# Patient Record
Sex: Male | Born: 1980 | Race: Black or African American | Hispanic: No | Marital: Single | Smoking: Former smoker
Health system: Southern US, Community
[De-identification: ages and names within clinical notes are randomized; demographics above are authoritative.]

## PROBLEM LIST (undated history)

## (undated) DIAGNOSIS — S0292XA Unspecified fracture of facial bones, initial encounter for closed fracture: Secondary | ICD-10-CM

## (undated) DIAGNOSIS — R519 Headache, unspecified: Secondary | ICD-10-CM

## (undated) DIAGNOSIS — T7840XA Allergy, unspecified, initial encounter: Secondary | ICD-10-CM

## (undated) DIAGNOSIS — M25511 Pain in right shoulder: Secondary | ICD-10-CM

## (undated) DIAGNOSIS — R51 Headache: Secondary | ICD-10-CM

## (undated) DIAGNOSIS — F419 Anxiety disorder, unspecified: Secondary | ICD-10-CM

## (undated) DIAGNOSIS — R011 Cardiac murmur, unspecified: Secondary | ICD-10-CM

## (undated) HISTORY — DX: Headache, unspecified: R51.9

## (undated) HISTORY — DX: Anxiety disorder, unspecified: F41.9

## (undated) HISTORY — DX: Pain in right shoulder: M25.511

## (undated) HISTORY — DX: Headache: R51

## (undated) HISTORY — DX: Unspecified fracture of facial bones, initial encounter for closed fracture: S02.92XA

## (undated) HISTORY — DX: Allergy, unspecified, initial encounter: T78.40XA

## (undated) HISTORY — PX: WISDOM TOOTH EXTRACTION: SHX21

## (undated) HISTORY — DX: Cardiac murmur, unspecified: R01.1

---

## 2004-10-21 ENCOUNTER — Emergency Department (HOSPITAL_COMMUNITY): Admission: EM | Admit: 2004-10-21 | Discharge: 2004-10-21 | Payer: Self-pay | Admitting: Emergency Medicine

## 2014-08-08 ENCOUNTER — Other Ambulatory Visit: Payer: Self-pay | Admitting: Emergency Medicine

## 2014-08-08 ENCOUNTER — Telehealth: Payer: Self-pay

## 2014-08-08 ENCOUNTER — Ambulatory Visit (INDEPENDENT_AMBULATORY_CARE_PROVIDER_SITE_OTHER): Payer: BC Managed Care – PPO | Admitting: Internal Medicine

## 2014-08-08 ENCOUNTER — Ambulatory Visit (INDEPENDENT_AMBULATORY_CARE_PROVIDER_SITE_OTHER): Payer: BC Managed Care – PPO

## 2014-08-08 VITALS — BP 110/74 | HR 80 | Temp 98.3°F | Resp 18 | Ht 66.25 in | Wt 168.0 lb

## 2014-08-08 DIAGNOSIS — R0602 Shortness of breath: Secondary | ICD-10-CM

## 2014-08-08 DIAGNOSIS — R05 Cough: Secondary | ICD-10-CM

## 2014-08-08 DIAGNOSIS — R059 Cough, unspecified: Secondary | ICD-10-CM

## 2014-08-08 DIAGNOSIS — Z113 Encounter for screening for infections with a predominantly sexual mode of transmission: Secondary | ICD-10-CM

## 2014-08-08 DIAGNOSIS — R002 Palpitations: Secondary | ICD-10-CM

## 2014-08-08 DIAGNOSIS — J4520 Mild intermittent asthma, uncomplicated: Secondary | ICD-10-CM

## 2014-08-08 DIAGNOSIS — R5383 Other fatigue: Secondary | ICD-10-CM

## 2014-08-08 LAB — POCT CBC
Granulocyte percent: 64.4 %G (ref 37–80)
HEMATOCRIT: 46.7 % (ref 43.5–53.7)
Hemoglobin: 15.3 g/dL (ref 14.1–18.1)
Lymph, poc: 2.5 (ref 0.6–3.4)
MCH: 30.1 pg (ref 27–31.2)
MCHC: 32.8 g/dL (ref 31.8–35.4)
MCV: 91.7 fL (ref 80–97)
MID (CBC): 0.5 (ref 0–0.9)
MPV: 9.4 fL (ref 0–99.8)
PLATELET COUNT, POC: 123 10*3/uL — AB (ref 142–424)
POC Granulocyte: 5.4 (ref 2–6.9)
POC LYMPH %: 29.2 % (ref 10–50)
POC MID %: 6.4 %M (ref 0–12)
RBC: 5.09 M/uL (ref 4.69–6.13)
RDW, POC: 13.4 %
WBC: 8.4 10*3/uL (ref 4.6–10.2)

## 2014-08-08 MED ORDER — AMOXICILLIN 875 MG PO TABS: 875.0000 mg | ORAL_TABLET | Freq: Two times a day (BID) | ORAL | Status: DC

## 2014-08-08 MED ORDER — PREDNISONE 20 MG PO TABS: ORAL_TABLET | ORAL | Status: DC

## 2014-08-08 MED ORDER — FLUTICASONE PROPIONATE 50 MCG/ACT NA SUSP: NASAL | Status: DC

## 2014-08-08 NOTE — Telephone Encounter (Signed)
Pt states he just saw dr Merla Richesdoolittle and was supposed to give him a list pcp providers but he does not have it   Please call [phone]

## 2014-08-08 NOTE — Progress Notes (Signed)
   Subjective:    Patient ID: Billy [name], male    DOB: 02/28/1981, 33 y.o.   MRN: [id number]  HPI coug for 2 months--started with allergies or cold--now interfe w/ sleep. Wakes every few nights with sob and palpitat and feeling he can't get air--breaths hard for 1-2 hr before resolves No fever or wt loss nonproduc No ST No hx asthma No gerd/dyspeps  Also feels tired often tho not inhibiting work or workouts Wants testing for [employer]([place] healthcare [place]  Also wants std scr-more than one partner in last 6 mo tho steady gf now-no sxtoms Just urinated   Review of Systems No chest pain No anxiety No gi/gu issues    Objective:   Physical Exam BP 110/74 mmHg  Pulse 80  Temp(Src) 98.3 F (36.8 C) (Oral)  Resp 18  Ht 5' 6.25" (1.683 m)  Wt 168 lb (76.204 kg)  BMI 26.90 kg/m2  SpO2 98% HEENT- purulent d/c nares Inject conjunctivae No nodes Chest w/ wheezing forced expiration--mild/produces cough Ht reg w/out m     [facility] reading (PRIMARY) by  Dr.Doolittle=NAD Results for orders placed or performed in visit on 08/08/14  POCT CBC  Result Value Ref Range   WBC 8.4 4.6 - 10.2 K/uL   Lymph, poc 2.5 0.6 - 3.4   POC LYMPH PERCENT 29.2 10 - 50 %L   MID (cbc) 0.5 0 - 0.9   POC MID % 6.4 0 - 12 %M   POC Granulocyte 5.4 2 - 6.9   Granulocyte percent 64.4 37 - 80 %G   RBC 5.09 4.69 - 6.13 M/uL   Hemoglobin 15.3 14.1 - 18.1 g/dL   HCT, POC 82.946.7 56.243.5 - 53.7 %   MCV 91.7 80 - 97 fL   MCH, POC 30.1 27 - 31.2 pg   MCHC 32.8 31.8 - 35.4 g/dL   RDW, POC 13.013.4 %   Platelet Count, POC 123 (A) 142 - 424 K/uL   MPV 9.4 0 - 99.8 fL     Assessment & Plan:  Cough - Plan: POCT CBC, DG Chest 2 View, CANCELED: DG Chest 2 View  Palpitations - Plan: Lipid panel  SOB (shortness of breath) - Plan: CANCELED: DG Chest 2 View  Other fatigue - Plan: POCT CBC, Comprehensive metabolic panel  Screen for STD (sexually transmitted disease) - Plan: HIV  antibody, RPR  F/u for uriprobe  RAD (reactive airway disease), mild intermittent, uncomplicated  Due to AR with persistent sinusitis  Meds ordered this encounter  Medications  . valACYclovir (VALTREX) 1000 MG tablet    Sig:     Refill:  0  . amoxicillin (AMOXIL) 875 MG tablet    Sig: Take 1 tablet (875 mg total) by mouth 2 (two) times daily.    Dispense:  20 tablet    Refill:  0  . fluticasone (FLONASE) 50 MCG/ACT nasal spray    Sig: 2 spr each nostril once a day for 1-2 months    Dispense:  16 g    Refill:  6  . predniSONE (DELTASONE) 20 MG tablet    Sig: 3/3/2/2/1/1 single daily dose for 6 days    Dispense:  12 tablet    Refill:  0   Notify labs Mail PCP ideas Note thrombocytopenia in letter

## 2014-08-09 LAB — COMPREHENSIVE METABOLIC PANEL
ALT: 24 U/L (ref 0–53)
AST: 43 U/L — AB (ref 0–37)
Albumin: 4.3 g/dL (ref 3.5–5.2)
Alkaline Phosphatase: 62 U/L (ref 39–117)
BUN: 13 mg/dL (ref 6–23)
CALCIUM: 9.2 mg/dL (ref 8.4–10.5)
CHLORIDE: 104 meq/L (ref 96–112)
CO2: 24 mEq/L (ref 19–32)
CREATININE: 1.03 mg/dL (ref 0.50–1.35)
GLUCOSE: 79 mg/dL (ref 70–99)
Potassium: 3.5 mEq/L (ref 3.5–5.3)
Sodium: 139 mEq/L (ref 135–145)
Total Bilirubin: 2 mg/dL — ABNORMAL HIGH (ref 0.2–1.2)
Total Protein: 7.1 g/dL (ref 6.0–8.3)

## 2014-08-09 LAB — LIPID PANEL
CHOL/HDL RATIO: 3.2 ratio
CHOLESTEROL: 135 mg/dL (ref 0–200)
HDL: 42 mg/dL (ref >39)
LDL Cholesterol: 79 mg/dL (ref 0–99)
TRIGLYCERIDES: 69 mg/dL (ref <150)
VLDL: 14 mg/dL (ref 0–40)

## 2014-08-09 LAB — HIV ANTIBODY (ROUTINE TESTING W REFLEX): HIV: NONREACTIVE

## 2014-08-09 LAB — RPR

## 2014-08-09 NOTE — Telephone Encounter (Signed)
Spoke to pt to advise PCP.

## 2014-08-09 NOTE — Telephone Encounter (Signed)
Dr Neva SeatGreene or [facility]Shaw [facility] Dr Karleen HampshireSpencer [place] [facility] [place] Kristian CoveyShane Tysinger [facility] [place]

## 2014-08-14 LAB — HEPATITIS C ANTIBODY: HCV Ab: NEGATIVE

## 2014-08-24 ENCOUNTER — Encounter: Payer: Self-pay | Admitting: Internal Medicine

## 2015-06-19 ENCOUNTER — Ambulatory Visit (INDEPENDENT_AMBULATORY_CARE_PROVIDER_SITE_OTHER): Payer: BLUE CROSS/BLUE SHIELD | Admitting: Family Medicine

## 2015-06-19 VITALS — BP 118/78 | HR 80 | Temp 98.4°F | Resp 16 | Ht 66.25 in | Wt 163.2 lb

## 2015-06-19 DIAGNOSIS — Z8709 Personal history of other diseases of the respiratory system: Secondary | ICD-10-CM | POA: Diagnosis not present

## 2015-06-19 DIAGNOSIS — G441 Vascular headache, not elsewhere classified: Secondary | ICD-10-CM | POA: Diagnosis not present

## 2015-06-19 DIAGNOSIS — R05 Cough: Secondary | ICD-10-CM | POA: Diagnosis not present

## 2015-06-19 DIAGNOSIS — R002 Palpitations: Secondary | ICD-10-CM | POA: Diagnosis not present

## 2015-06-19 DIAGNOSIS — R059 Cough, unspecified: Secondary | ICD-10-CM

## 2015-06-19 LAB — LIPID PANEL
Cholesterol: 169 mg/dL (ref 125–200)
HDL: 64 mg/dL (ref >=40)
LDL Cholesterol: 93 mg/dL (ref <130)
Total CHOL/HDL Ratio: 2.6 Ratio (ref <=5.0)
Triglycerides: 59 mg/dL (ref <150)
VLDL: 12 mg/dL (ref <30)

## 2015-06-19 LAB — POCT GLYCOSYLATED HEMOGLOBIN (HGB A1C): Hemoglobin A1C: 5.3

## 2015-06-19 LAB — BASIC METABOLIC PANEL WITH GFR
BUN: 12 mg/dL (ref 7–25)
CO2: 27 mmol/L (ref 20–31)
Calcium: 9.3 mg/dL (ref 8.6–10.3)
Chloride: 102 mmol/L (ref 98–110)
Creat: 0.94 mg/dL (ref 0.60–1.35)
GFR, Est African American: >89 mL/min (ref >=60)
GFR, Est Non African American: >89 mL/min (ref >=60)
Glucose, Bld: 91 mg/dL (ref 65–99)
Potassium: 4 mmol/L (ref 3.5–5.3)
Sodium: 138 mmol/L (ref 135–146)

## 2015-06-19 LAB — GLUCOSE, POCT (MANUAL RESULT ENTRY): POC Glucose: 87 mg/dl (ref 70–99)

## 2015-06-19 LAB — HEMOGLOBIN A1C: HEMOGLOBIN A1C: 5.3 % (ref 4.0–6.0)

## 2015-06-19 MED ORDER — HYDROCODONE-HOMATROPINE 5-1.5 MG/5ML PO SYRP: 5.0000 mL | ORAL_SOLUTION | Freq: Three times a day (TID) | ORAL | Status: DC | PRN

## 2015-06-19 MED ORDER — AZITHROMYCIN 250 MG PO TABS: ORAL_TABLET | ORAL | Status: DC

## 2015-06-19 MED ORDER — ALBUTEROL SULFATE HFA 108 (90 BASE) MCG/ACT IN AERS: 2.0000 | INHALATION_SPRAY | RESPIRATORY_TRACT | Status: DC | PRN

## 2015-06-19 NOTE — Progress Notes (Addendum)
This chart was scribed for Elvina SidleKurt Everitt Wenner, MD by Stann Oresung-Kai Tsai, medical scribe at [facility].The patient was seen in exam room 9 and the patient's care was started at 9:39 AM.  Patient ID: Billy [name] MRN: [id number], DOB: 06/17/1981, 34 y.o. Date of Encounter: 06/19/2015  Primary Physician: No PCP Per Patient  Chief Complaint:  Chief Complaint  Patient presents with  . Cough    x 1 week  . Shortness of Breath    x 1 week  . Chest Pain    x 1 week    HPI:  Billy [name] is a 34 y.o. male who [facility] complaining of shortness of breath with dry cough and chest pain starting 8 days ago. He states that he has occasional clear mucus when he coughs. He coughs more at night when he goes to bed than morning. He has h/o asthma when he was a child and used an inhaler. He denies having an inhaler now. He denies fever. He denies smoking. Last year, he came in with similar issue, and at that time, he would wake up gasping for air. He notes top of his forehead throbbing and racing heart beat yesterday.   He also noticed that he gets an uncomfortable feeling after eating butter in his foods recently. He recently ate foods that he normally wouldn't eat, greasy and some alcohol.   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   Past Medical History  Diagnosis Date  . Allergy   . Anxiety      Home Meds: Prior to Admission medications   Medication Sig Start Date End Date Taking? Authorizing Provider  amoxicillin (AMOXIL) 875 MG tablet Take 1 tablet (875 mg total) by mouth 2 (two) times daily. Patient not taking: Reported on 06/19/2015 08/08/14   Tonye Pearsonobert P Doolittle, MD  fluticasone [facility](FLONASE) 50 MCG/ACT nasal spray 2 spr each nostril once a day for 1-2 months Patient not taking: Reported on 06/19/2015 08/08/14   Tonye Pearsonobert P Doolittle, MD  predniSONE (DELTASONE) 20 MG tablet 3/3/2/2/1/1 single daily  dose for 6 days Patient not taking: Reported on 06/19/2015 08/08/14   Tonye Pearsonobert P Doolittle, MD  valACYclovir (VALTREX) 1000 MG tablet  06/11/14   Historical Provider, MD    Allergies:  Allergies  Allergen Reactions  . Dust Mite Extract   . Molds & Smuts     Social History   Social History  . Marital Status: Single    Spouse Name: N/A  . Number of Children: N/A  . Years of Education: N/A   Occupational History  . Not on file.   Social History Main Topics  . Smoking status: Former Smoker -- 0.50 packs/day for 1.5 years    Types: Cigarettes  . Smokeless tobacco: Not on file  . Alcohol Use: 1.8 - 2.4 oz/week    3-4 Standard drinks or equivalent per week  . Drug Use: No  . Sexual Activity: Not on file   Other Topics Concern  . Not on file   Social History Narrative     Review of Systems: Constitutional: negative for chills, fever, night sweats, weight changes, or fatigue  HEENT: negative for vision changes, hearing loss, congestion, rhinorrhea, ST, epistaxis, or sinus pressure Cardiovascular: negative for palpitations; positive for chest pain Respiratory: negative for hemoptysis, wheezing; positive for cough, shortness of breath Abdominal: negative for abdominal pain, nausea, vomiting, diarrhea, or constipation Dermatological: negative for rash  Neurologic: negative for dizziness, or syncope; positive for headache All other systems reviewed and are otherwise negative with the exception to those above and in the HPI.  Physical Exam: Blood pressure 118/78, pulse 80, temperature 98.4 F (36.9 C), temperature source Oral, resp. rate 16, height 5' 6.25" (1.683 m), weight 163 lb 3.2 oz (74.027 kg), SpO2 98 %., Body mass index is 26.13 kg/(m^2). General: Well developed, well nourished, in no acute distress. Head: Normocephalic, atraumatic, eyes without discharge, sclera non-icteric, nares are without discharge. Bilateral auditory canals clear, TM's are without perforation, pearly  grey and translucent with reflective cone of light bilaterally. Oral cavity moist, posterior pharynx without exudate, erythema, peritonsillar abscess, or post nasal drip.  Neck: Supple. No thyromegaly. Full ROM. No lymphadenopathy. Lungs: Clear bilaterally to auscultation without wheezes, rales, or rhonchi. Breathing is unlabored. Heart: RRR with S1 S2. No murmurs, rubs, or gallops appreciated. Abdomen: Soft, non-tender, non-distended with normoactive bowel sounds. No hepatomegaly. No rebound/guarding. No obvious abdominal masses. Msk:  Strength and tone normal for age. Extremities/Skin: Warm and dry. No clubbing or cyanosis. No edema. No rashes or suspicious lesions. Neuro: Alert and oriented X 3. Moves all extremities spontaneously. Gait is normal. CNII-XII grossly in tact. Psych:  Responds to questions appropriately with a normal affect.   Labs: Results for orders placed or performed in visit on 06/19/15  POCT glycosylated hemoglobin (Hb A1C)  Result Value Ref Range   Hemoglobin A1C 5.3   POCT glucose (manual entry)  Result Value Ref Range   POC Glucose 87 70 - 99 mg/dl     ASSESSMENT AND PLAN:  34 y.o. year old male with anxiety about his cough, palpitations and pulsating headaches.  The palpitations and headache are intermittent and not present today.  He appears to be in good health. This chart was scribed in my presence and reviewed by me personally.    ICD-9-CM ICD-10-CM   1. Cough 786.2 R05 albuterol (PROVENTIL HFA;VENTOLIN HFA) 108 (90 BASE) MCG/ACT inhaler     azithromycin (ZITHROMAX) 250 MG tablet     HYDROcodone-homatropine (HYCODAN) 5-1.5 MG/5ML syrup  2. Palpitations 785.1 R00.2 EKG 12-Lead     POCT glycosylated hemoglobin (Hb A1C)     POCT glucose (manual entry)     BASIC METABOLIC PANEL WITH GFR     Lipid panel  3. H/O intrinsic asthma V12.69 Z87.09   4. Other vascular headache 784.0 G44.1     By signing my name below, I, Stann Ore, attest that this  documentation has been prepared under the direction and in the presence of Elvina Sidle, MD. Electronically Signed: Stann Ore, Scribe. 06/19/2015 , 9:39 AM .  Signed, Elvina Sidle, MD 06/19/2015 9:39 AM

## 2015-06-19 NOTE — Patient Instructions (Signed)

## 2015-06-26 ENCOUNTER — Encounter: Payer: Self-pay | Admitting: Family Medicine

## 2015-07-01 DIAGNOSIS — T7840XA Allergy, unspecified, initial encounter: Secondary | ICD-10-CM | POA: Insufficient documentation

## 2015-07-01 DIAGNOSIS — F419 Anxiety disorder, unspecified: Secondary | ICD-10-CM | POA: Insufficient documentation

## 2015-07-04 ENCOUNTER — Ambulatory Visit: Payer: Self-pay | Admitting: Cardiovascular Disease

## 2015-07-15 ENCOUNTER — Ambulatory Visit (INDEPENDENT_AMBULATORY_CARE_PROVIDER_SITE_OTHER): Payer: BLUE CROSS/BLUE SHIELD | Admitting: Physician Assistant

## 2015-07-15 ENCOUNTER — Encounter: Payer: Self-pay | Admitting: Physician Assistant

## 2015-07-15 ENCOUNTER — Ambulatory Visit (INDEPENDENT_AMBULATORY_CARE_PROVIDER_SITE_OTHER): Payer: BLUE CROSS/BLUE SHIELD

## 2015-07-15 VITALS — BP 125/68 | HR 92 | Temp 98.5°F | Resp 16 | Ht 65.75 in | Wt 161.2 lb

## 2015-07-15 DIAGNOSIS — R058 Other specified cough: Secondary | ICD-10-CM

## 2015-07-15 DIAGNOSIS — R059 Cough, unspecified: Secondary | ICD-10-CM

## 2015-07-15 DIAGNOSIS — R05 Cough: Secondary | ICD-10-CM

## 2015-07-15 DIAGNOSIS — J329 Chronic sinusitis, unspecified: Secondary | ICD-10-CM

## 2015-07-15 MED ORDER — GUAIFENESIN ER 1200 MG PO TB12: 1.0000 | ORAL_TABLET | Freq: Two times a day (BID) | ORAL | Status: DC | PRN

## 2015-07-15 MED ORDER — HYDROCOD POLST-CPM POLST ER 10-8 MG/5ML PO SUER: 5.0000 mL | Freq: Every evening | ORAL | Status: DC | PRN

## 2015-07-15 MED ORDER — AMOXICILLIN-POT CLAVULANATE 875-125 MG PO TABS: 1.0000 | ORAL_TABLET | Freq: Two times a day (BID) | ORAL | Status: AC

## 2015-07-15 MED ORDER — IPRATROPIUM BROMIDE 0.03 % NA SOLN: 2.0000 | Freq: Two times a day (BID) | NASAL | Status: DC

## 2015-07-15 NOTE — Patient Instructions (Addendum)
Please use the nasal saline spray daily.   64 oz of water per day.    Sinusitis, Adult Sinusitis is redness, soreness, and inflammation of the paranasal sinuses. Paranasal sinuses are air pockets within the bones of your face. They are located beneath your eyes, in the middle of your forehead, and above your eyes. In healthy paranasal sinuses, mucus is able to drain out, and air is able to circulate through them by way of your nose. However, when your paranasal sinuses are inflamed, mucus and air can become trapped. This can allow bacteria and other germs to grow and cause infection. Sinusitis can develop quickly and last only a short time (acute) or continue over a long period (chronic). Sinusitis that lasts for more than 12 weeks is considered chronic. CAUSES Causes of sinusitis include:  Allergies.  Structural abnormalities, such as displacement of the cartilage that separates your nostrils (deviated septum), which can decrease the air flow through your nose and sinuses and affect sinus drainage.  Functional abnormalities, such as when the small hairs (cilia) that line your sinuses and help remove mucus do not work properly or are not present. SIGNS AND SYMPTOMS Symptoms of acute and chronic sinusitis are the same. The primary symptoms are pain and pressure around the affected sinuses. Other symptoms include:  Upper toothache.  Earache.  Headache.  Bad breath.  Decreased sense of smell and taste.  A cough, which worsens when you are lying flat.  Fatigue.  Fever.  Thick drainage from your nose, which often is green and may contain pus (purulent).  Swelling and warmth over the affected sinuses. DIAGNOSIS Your health care provider will perform a physical exam. During your exam, your health care provider may perform any of the following to help determine if you have acute sinusitis or chronic sinusitis:  Look in your nose for signs of abnormal growths in your nostrils (nasal  polyps).  Tap over the affected sinus to check for signs of infection.  View the inside of your sinuses using an imaging device that has a light attached (endoscope). If your health care provider suspects that you have chronic sinusitis, one or more of the following tests may be recommended:  Allergy tests.  Nasal culture. A sample of mucus is taken from your nose, sent to a lab, and screened for bacteria.  Nasal cytology. A sample of mucus is taken from your nose and examined by your health care provider to determine if your sinusitis is related to an allergy. TREATMENT Most cases of acute sinusitis are related to a viral infection and will resolve on their own within 10 days. Sometimes, medicines are prescribed to help relieve symptoms of both acute and chronic sinusitis. These may include pain medicines, decongestants, nasal steroid sprays, or saline sprays. However, for sinusitis related to a bacterial infection, your health care provider will prescribe antibiotic medicines. These are medicines that will help kill the bacteria causing the infection. Rarely, sinusitis is caused by a fungal infection. In these cases, your health care provider will prescribe antifungal medicine. For some cases of chronic sinusitis, surgery is needed. Generally, these are cases in which sinusitis recurs more than 3 times per year, despite other treatments. HOME CARE INSTRUCTIONS  Drink plenty of water. Water helps thin the mucus so your sinuses can drain more easily.  Use a humidifier.  Inhale steam 3-4 times a day (for example, sit in the bathroom with the shower running).  Apply a warm, moist washcloth to your face 3-4  times a day, or as directed by your health care provider.  Use saline nasal sprays to help moisten and clean your sinuses.  Take medicines only as directed by your health care provider.  If you were prescribed either an antibiotic or antifungal medicine, finish it all even if you start  to feel better. SEEK IMMEDIATE MEDICAL CARE IF:  You have increasing pain or severe headaches.  You have nausea, vomiting, or drowsiness.  You have swelling around your face.  You have vision problems.  You have a stiff neck.  You have difficulty breathing.   This information is not intended to replace advice given to you by your health care provider. Make sure you discuss any questions you have with your health care provider.   Document Released: 08/03/2005 Document Revised: 08/24/2014 Document Reviewed: 08/18/2011 Elsevier Interactive Patient Education [employer]2016 Elsevier Inc.

## 2015-07-15 NOTE — Progress Notes (Signed)
[facility] [address], [place] [address] [phone]  Date:  07/15/2015   Name:  Billy BodyJerry [name]   DOB:  10/12/1980   MRN:  [id number]  PCP:  No PCP Per Patient   Chief Complaint  Patient presents with  . Cough    PRODUCTIVE with mucus     History of Present Illness:  Billy [name] is a 34 y.o. male patient who [facility] productive cough and sinus pressure for 6 days.  Cough with a productive mucus. Sore throat.   No sob, or dyspnea.  No fever.  No GI symptoms.   No sick contacts though he has been travelling a lot.     Patient Active Problem List   Diagnosis Date Noted  . Allergy   . Anxiety     Past Medical History  Diagnosis Date  . Allergy   . Anxiety     Past Surgical History  Procedure Laterality Date  . Wisdom tooth extraction      Social History  Substance Use Topics  . Smoking status: Former Smoker -- 0.50 packs/day for 1.5 years    Types: Cigarettes  . Smokeless tobacco: None  . Alcohol Use: 1.8 - 2.4 oz/week    3-4 Standard drinks or equivalent per week    Family History  Problem Relation Age of Onset  . Hypertension Mother   . Diabetes Maternal Grandmother   . Heart disease Maternal Grandmother   . Stroke Maternal Grandmother   . Stroke Maternal Grandfather   . Heart disease Maternal Grandfather   . Diabetes Paternal Grandmother   . Hyperlipidemia Paternal Grandmother   . Hypertension Paternal Grandmother   . Hypertension Paternal Grandfather   . Cancer Paternal Grandfather     Allergies  Allergen Reactions  . Dust Mite Extract   . Molds & Smuts     Medication list has been reviewed and updated.  Current Outpatient Prescriptions on File Prior to Visit  Medication Sig Dispense Refill  . albuterol (PROVENTIL HFA;VENTOLIN HFA) 108 (90 BASE) MCG/ACT inhaler Inhale 2 puffs into the lungs every 4 (four) hours as needed for wheezing or shortness of breath (cough, shortness of breath or wheezing.). 1  Inhaler 1  . azithromycin (ZITHROMAX) 250 MG tablet Take 2 tabs PO x 1 dose, then 1 tab PO QD x 4 days (Patient not taking: Reported on 07/15/2015) 6 tablet 0  . fluticasone (FLONASE) 50 MCG/ACT nasal spray 2 spr each nostril once a day for 1-2 months (Patient not taking: Reported on 06/19/2015) 16 g 6  . HYDROcodone-homatropine (HYCODAN) 5-1.5 MG/5ML syrup Take 5 mLs by mouth every 8 (eight) hours as needed for cough. (Patient not taking: Reported on 07/15/2015) 120 mL 0  . valACYclovir (VALTREX) 1000 MG tablet   0   No current facility-administered medications on file prior to visit.    ROS ROS otherwise unremarkable unless listed above.  Physical Examination: BP 125/68 mmHg  Pulse 92  Temp(Src) 98.5 F (36.9 C) (Oral)  Resp 16  Ht 5' 5.75" (1.67 m)  Wt 161 lb 3.2 oz (73.12 kg)  BMI 26.22 kg/m2 Ideal [name] Weight: Weight in (lb) to have BMI = 25: 153.4  Physical Exam  Constitutional: He is oriented to person, place, and time. He appears well-developed and well-nourished. No distress.  HENT:  Head: Atraumatic.  Right Ear: Tympanic membrane, external ear and ear canal normal.  Left Ear: Tympanic membrane, external ear and ear canal normal.  Nose: Mucosal edema and rhinorrhea present. Right sinus exhibits no maxillary sinus tenderness and no frontal sinus tenderness. Left sinus exhibits no maxillary sinus tenderness and no frontal sinus tenderness.  Mouth/Throat: No uvula swelling. No oropharyngeal exudate, posterior oropharyngeal edema or posterior oropharyngeal erythema.  Eyes: Conjunctivae, EOM and lids are normal. Pupils are equal, round, and reactive to light. Right eye exhibits normal extraocular motion. Left eye exhibits normal extraocular motion.  Neck: Trachea normal and full passive range of motion without pain. No edema and no erythema present.  Cardiovascular: Normal rate.   Pulmonary/Chest: Effort normal. No apnea. No respiratory distress. He has no decreased breath  sounds. He has no wheezes. He has rhonchi (rhonchi sounds appreciated at the lower left lobe).  Lymphadenopathy:       Head (right side): No submandibular and no tonsillar adenopathy present.       Head (left side): No submandibular and no tonsillar adenopathy present.    He has no cervical adenopathy.  Neurological: He is alert and oriented to person, place, and time.  Skin: Skin is warm and dry. He is not diaphoretic.  Psychiatric: He has a normal mood and affect. His behavior is normal.    [facility] reading (PRIMARY) by  Dr. Katrinka Blazing: No acute findings.  Assessment and Plan: Billy [name] is a 34 y.o. male who is here today for productive cough and nasal congestion.   -augmentin given today -supportive treatment given.  Sinusitis, unspecified chronicity, unspecified location - Plan: amoxicillin-clavulanate (AUGMENTIN) 875-125 MG tablet, chlorpheniramine-HYDROcodone (TUSSIONEX PENNKINETIC ER) 10-8 MG/5ML SUER, ipratropium (ATROVENT) 0.03 % nasal spray, Guaifenesin (MUCINEX MAXIMUM STRENGTH) 1200 MG TB12  Productive cough - Plan: DG Chest 2 View  Cough   Trena Platt, PA-C [facility] 07/15/2015 3:37 PM

## 2015-08-05 ENCOUNTER — Encounter: Payer: Self-pay | Admitting: Physician Assistant

## 2015-08-05 ENCOUNTER — Ambulatory Visit (INDEPENDENT_AMBULATORY_CARE_PROVIDER_SITE_OTHER): Payer: BLUE CROSS/BLUE SHIELD | Admitting: Physician Assistant

## 2015-08-05 VITALS — BP 131/79 | HR 72 | Temp 98.3°F | Resp 16 | Ht 66.5 in | Wt 160.0 lb

## 2015-08-05 DIAGNOSIS — Z130 Encounter for screening for diseases of the blood and blood-forming organs and certain disorders involving the immune mechanism: Secondary | ICD-10-CM

## 2015-08-05 DIAGNOSIS — Z1329 Encounter for screening for other suspected endocrine disorder: Secondary | ICD-10-CM

## 2015-08-05 DIAGNOSIS — Z113 Encounter for screening for infections with a predominantly sexual mode of transmission: Secondary | ICD-10-CM

## 2015-08-05 DIAGNOSIS — Z0000 Encounter for general adult medical examination without abnormal findings: Secondary | ICD-10-CM

## 2015-08-05 DIAGNOSIS — Z13220 Encounter for screening for lipoid disorders: Secondary | ICD-10-CM | POA: Diagnosis not present

## 2015-08-05 DIAGNOSIS — Z13228 Encounter for screening for other metabolic disorders: Secondary | ICD-10-CM

## 2015-08-05 DIAGNOSIS — Z23 Encounter for immunization: Secondary | ICD-10-CM | POA: Diagnosis not present

## 2015-08-05 LAB — CBC
HEMATOCRIT: 50 % (ref 39.0–52.0)
HEMOGLOBIN: 17.4 g/dL — AB (ref 13.0–17.0)
MCH: 31.7 pg (ref 26.0–34.0)
MCHC: 34.8 g/dL (ref 30.0–36.0)
MCV: 91.1 fL (ref 78.0–100.0)
MPV: 12.1 fL (ref 8.6–12.4)
Platelets: 151 10*3/uL (ref 150–400)
RBC: 5.49 MIL/uL (ref 4.22–5.81)
RDW: 14.2 % (ref 11.5–15.5)
WBC: 9.1 10*3/uL (ref 4.0–10.5)

## 2015-08-05 LAB — LIPID PANEL
CHOL/HDL RATIO: 2.9 ratio (ref <=5.0)
Cholesterol: 166 mg/dL (ref 125–200)
HDL: 58 mg/dL (ref >=40)
LDL Cholesterol: 81 mg/dL (ref <130)
Triglycerides: 137 mg/dL (ref <150)
VLDL: 27 mg/dL (ref <30)

## 2015-08-05 LAB — COMPLETE METABOLIC PANEL WITH GFR
ALBUMIN: 4.5 g/dL (ref 3.6–5.1)
ALK PHOS: 74 U/L (ref 40–115)
ALT: 16 U/L (ref 9–46)
AST: 22 U/L (ref 10–40)
BUN: 11 mg/dL (ref 7–25)
CO2: 29 mmol/L (ref 20–31)
Calcium: 9.6 mg/dL (ref 8.6–10.3)
Chloride: 103 mmol/L (ref 98–110)
Creat: 0.89 mg/dL (ref 0.60–1.35)
GFR, Est African American: >89 mL/min (ref >=60)
GLUCOSE: 85 mg/dL (ref 65–99)
POTASSIUM: 4.1 mmol/L (ref 3.5–5.3)
SODIUM: 138 mmol/L (ref 135–146)
Total Bilirubin: 1 mg/dL (ref 0.2–1.2)
Total Protein: 8.2 g/dL — ABNORMAL HIGH (ref 6.1–8.1)

## 2015-08-05 LAB — TSH: TSH: 1.08 u[IU]/mL (ref 0.350–4.500)

## 2015-08-05 NOTE — Patient Instructions (Signed)

## 2015-08-05 NOTE — Progress Notes (Signed)
[facility] [address], [place] [id number] [phone]  Date:  08/05/2015   Name:  Billy BodyJerry [name]   DOB:  07/30/1981   MRN:  [id number]  PCP:  No PCP Per Patient    History of Present Illness:  Billy [name] is a 34 y.o. male patient who [facility] exam  --Diet: avoids fried foods and starches.  He eats a lot of vegetables shakes and fruits, and nuts.  Water intake: 4-5, 8 oz cups.  Minimal carbonated 1 per week.  Sweet tea sometimes 1 every other week  --BM: normal daily, no blood in stool, no constipation, or diarrhea  --Urination: no hematuria, frequency, or dysuria  --Sleep: sleep is good.  Waking up startled.  Sleep apnea issue once per month.  This was about 2 weeks ago.  Wakes up gasping for air.  Social activities:  Binge exercises of 4-5 weeks, eating healthy. Take a break.   EtOH: 3 drinks average per week. Tobacco use: none Illicit drug use: none Sexually active: protected unless with monogamous.    Hobbies: basketball games to travelling.  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      Patient Active Problem List   Diagnosis Date Noted  . Allergy   . Anxiety     Past Medical History  Diagnosis Date  . Allergy   . Anxiety   . Heart murmur     Past Surgical History  Procedure Laterality Date  . Wisdom tooth extraction      Social History  Substance Use Topics  . Smoking status: Former Smoker -- 0.50 packs/day for 1.5 years    Types: Cigarettes  . Smokeless tobacco: None  . Alcohol Use: 1.8 - 2.4 oz/week    3-4 Standard drinks or equivalent per week    Family History  Problem Relation Age of Onset  . Hypertension Mother   . Diabetes Mother   . Diabetes Maternal Grandmother   . Heart disease Maternal Grandmother   . Stroke Maternal Grandmother   . Hyperlipidemia Maternal Grandmother   . Hypertension Maternal Grandmother   . Stroke Maternal Grandfather   . Heart disease  Maternal Grandfather   . Diabetes Maternal Grandfather   . Hyperlipidemia Maternal Grandfather   . Diabetes Paternal Grandmother   . Hyperlipidemia Paternal Grandmother   . Hypertension Paternal Grandmother   . Hypertension Paternal Grandfather   . Cancer Paternal Grandfather     Allergies  Allergen Reactions  . Dust Mite Extract   . Molds & Smuts     Medication list has been reviewed and updated.  Current Outpatient Prescriptions on File Prior to Visit  Medication Sig Dispense Refill  . ipratropium (ATROVENT) 0.03 % nasal spray Place 2 sprays into both nostrils 2 (two) times daily. 30 mL 0  . albuterol (PROVENTIL HFA;VENTOLIN HFA) 108 (90 BASE) MCG/ACT inhaler Inhale 2 puffs into the lungs every 4 (four) hours as needed for wheezing or shortness of breath (cough, shortness of breath or wheezing.). (Patient not taking: Reported on 08/05/2015) 1 Inhaler 1  . fluticasone (FLONASE) 50 MCG/ACT nasal spray 2 spr each nostril once a day for 1-2 months (Patient not taking: Reported on 06/19/2015) 16 g 6  . valACYclovir (VALTREX) 1000 MG tablet Reported on 08/05/2015  0   No current facility-administered medications on file prior to visit.    Review of Systems  Constitutional: Negative for fever and chills.  HENT: Negative for ear discharge,  ear pain and sore throat.   Eyes: Negative for blurred vision and double vision.  Respiratory: Negative for cough, shortness of breath and wheezing.   Cardiovascular: Negative for chest pain, palpitations and leg swelling.  Gastrointestinal: Negative for nausea, vomiting and diarrhea.  Genitourinary: Negative for dysuria, frequency and hematuria.  Skin: Negative for itching and rash.  Neurological: Negative for dizziness and headaches.     Physical Examination: BP 131/79 mmHg  Pulse 72  Temp(Src) 98.3 F (36.8 C) (Oral)  Resp 16  Ht 5' 6.5" (1.689 m)  Wt 160 lb (72.576 kg)  BMI 25.44 kg/m2  SpO2 97% Ideal [name] Weight: Weight in (lb) to  have BMI = 25: 156.9  Physical Exam  Constitutional: He is oriented to person, place, and time. He appears well-developed and well-nourished. No distress.  HENT:  Head: Normocephalic and atraumatic.  Right Ear: Tympanic membrane, external ear and ear canal normal.  Left Ear: Tympanic membrane, external ear and ear canal normal.  Eyes: Conjunctivae and EOM are normal. Pupils are equal, round, and reactive to light.  Cardiovascular: Normal rate and regular rhythm.  Exam reveals no friction rub.   No murmur heard. Pulmonary/Chest: Effort normal. No respiratory distress. He has no wheezes.  Abdominal: Soft. Bowel sounds are normal. He exhibits no distension and no mass. There is no tenderness.  Musculoskeletal: Normal range of motion. He exhibits no edema or tenderness.  Neurological: He is alert and oriented to person, place, and time. He displays normal reflexes.  Skin: Skin is warm and dry. He is not diaphoretic.  Psychiatric: He has a normal mood and affect. His behavior is normal.     Assessment and Plan: Billy [name] is a 34 y.o. male who is here today  For annual physical exam.  Declines std check since this was obtained recently.  Gonorrhea chlamydia test given.  Annual physical exam - Plan: CBC, Lipid panel, TSH, COMPLETE METABOLIC PANEL WITH GFR, GC/Chlamydia Probe Amp  Need for prophylactic vaccination with combined diphtheria-tetanus-pertussis (DTP) vaccine - Plan: Tdap vaccine greater than or equal to 7yo IM  Screening for deficiency anemia - Plan: CBC  Screening for thyroid disorder - Plan: TSH  Screening for metabolic disorder - Plan: COMPLETE METABOLIC PANEL WITH GFR  Screening for lipid disorders - Plan: Lipid panel  Screening for STD (sexually transmitted disease) - Plan: GC/Chlamydia Probe Amp   Trena Platt, PA-C [facility] 08/05/2015 4:16 PM

## 2015-08-08 ENCOUNTER — Encounter: Payer: Self-pay | Admitting: Physician Assistant

## 2015-08-19 ENCOUNTER — Telehealth: Payer: Self-pay

## 2015-08-19 NOTE — Telephone Encounter (Signed)
Pt left VM requesting lab results. Attempted to call pt, but VM box is full. All labs were normal.

## 2015-10-30 ENCOUNTER — Encounter: Payer: Self-pay | Admitting: Family Medicine

## 2015-10-30 ENCOUNTER — Ambulatory Visit (INDEPENDENT_AMBULATORY_CARE_PROVIDER_SITE_OTHER): Payer: BLUE CROSS/BLUE SHIELD | Admitting: Family Medicine

## 2015-10-30 VITALS — BP 120/82 | HR 92 | Temp 98.6°F | Resp 16 | Ht 66.0 in | Wt 165.6 lb

## 2015-10-30 DIAGNOSIS — J329 Chronic sinusitis, unspecified: Secondary | ICD-10-CM

## 2015-10-30 DIAGNOSIS — J309 Allergic rhinitis, unspecified: Secondary | ICD-10-CM

## 2015-10-30 MED ORDER — AMOXICILLIN-POT CLAVULANATE 875-125 MG PO TABS: 1.0000 | ORAL_TABLET | Freq: Two times a day (BID) | ORAL | Status: DC

## 2015-10-30 MED ORDER — FLUTICASONE PROPIONATE 50 MCG/ACT NA SUSP: NASAL | Status: DC

## 2015-10-30 NOTE — Progress Notes (Signed)
Subjective:  By signing my name below, I, Raven Small, attest that this documentation has been prepared under the direction and in the presence of Meredith StaggersJeffrey Ronte Parker, MD.  Electronically Signed: Andrew Auaven Small, ED Scribe. 10/30/2015. 1:58 PM.   Patient ID: Billy [name], male    DOB: 03/15/1981, 35 y.o.   MRN: [id number]  HPI   Chief Complaint  Patient presents with  . sinus pressure    x 2 wks  . fatigue  . nasal congestion    "sometimes thick white, yellow x8 days   HPI Comments: Billy [name] is a 35 y.o. male who [facility] complaining of sinus congestion and sinus pressure that began 2 weeks ago. Pt states symptoms stated with sinus congestion with initially white mucous, then yellow mucous 1 weeks ago. He reports associated itchy, watery eye, sneezing and postnasal drip. He reports puffiness around his eyes first thing in the morning. He used flonase for 2-3 days until he ran out and zyrtec for 4 days. He denies fever, chills and eye pain. Pt reports recurrent sinus problems about 4 times a year. Pt reports issues with snoring due to congestions. He would like a referral to ENT.   Pt works for a Programme researcher, broadcasting/film/videocar dealership and flips houses.    Patient Active Problem List   Diagnosis Date Noted  . Allergy   . Anxiety    Past Medical History  Diagnosis Date  . Allergy   . Anxiety   . Heart murmur    Past Surgical History  Procedure Laterality Date  . Wisdom tooth extraction     Allergies  Allergen Reactions  . Dust Mite Extract   . Molds & Smuts    Prior to Admission medications   Medication Sig Start Date End Date Taking? Authorizing Provider  albuterol (PROVENTIL HFA;VENTOLIN HFA) 108 (90 BASE) MCG/ACT inhaler Inhale 2 puffs into the lungs every 4 (four) hours as needed for wheezing or shortness of breath (cough, shortness of breath or wheezing.). 06/19/15  Yes Elvina SidleKurt Lauenstein, MD  valACYclovir (VALTREX) 1000 MG tablet Reported on 08/05/2015 06/11/14   Yes Historical Provider, MD  fluticasone (FLONASE) 50 MCG/ACT nasal spray 2 spr each nostril once a day for 1-2 months Patient not taking: Reported on 06/19/2015 08/08/14   Tonye Pearsonobert P Doolittle, MD  ipratropium (ATROVENT) 0.03 % nasal spray Place 2 sprays into both nostrils 2 (two) times daily. Patient not taking: Reported on 10/30/2015 07/15/15   Garnetta BuddyStephanie D English, PA   Social History   Social History  . Marital Status: Single    Spouse Name: N/A  . Number of Children: N/A  . Years of Education: N/A   Occupational History  . Engineering    Social History Main Topics  . Smoking status: Former Smoker -- 0.50 packs/day for 1.5 years    Types: Cigarettes  . Smokeless tobacco: Not on file  . Alcohol Use: 1.8 - 2.4 oz/week    3-4 Standard drinks or equivalent per week  . Drug Use: No  . Sexual Activity:    Partners: Female   Other Topics Concern  . Not on file   Social History Narrative   Review of Systems  Constitutional: Negative for fever and chills.  HENT: Positive for congestion, postnasal drip, sinus pressure and sneezing. Negative for ear pain.   Eyes: Positive for discharge ( watery) and itching. Negative for pain.    Objective:   Physical Exam  Constitutional: He is oriented to person,  place, and time. He appears well-developed and well-nourished.  HENT:  Head: Normocephalic and atraumatic.  Right Ear: Tympanic membrane, external ear and ear canal normal.  Left Ear: Tympanic membrane, external ear and ear canal normal.  Mouth/Throat: Oropharynx is clear and moist and mucous membranes are normal. No oropharyngeal exudate or posterior oropharyngeal erythema.  Minimal infraorbital edema. Frontal and maxilrry sinus pressure. Edema of the turbinates.   Eyes: Conjunctivae are normal. Pupils are equal, round, and reactive to light.  Neck: Neck supple.  Cardiovascular: Normal rate, regular rhythm, normal heart sounds and intact distal pulses.   No murmur  heard. Pulmonary/Chest: Effort normal and breath sounds normal. He has no wheezes. He has no rhonchi. He has no rales.  Abdominal: Soft. There is no tenderness.  Lymphadenopathy:    He has no cervical adenopathy.  Neurological: He is alert and oriented to person, place, and time.  Skin: Skin is warm and dry. No rash noted.  Psychiatric: He has a normal mood and affect. His behavior is normal.  Vitals reviewed.   Filed Vitals:   10/30/15 1347  BP: 120/82  Pulse: 92  Temp: 98.6 F (37 C)  TempSrc: Oral  Resp: 16  Height: 5\' 6"  (1.676 m)  Weight: 165 lb 9.6 oz (75.116 kg)  SpO2: 98%   Assessment & Plan:   Billy [name] is a 35 y.o. male Allergic rhinitis, unspecified allergic rhinitis type - Plan: fluticasone (FLONASE) 50 MCG/ACT nasal spray  Recurrent sinusitis - Plan: Ambulatory referral to ENT, amoxicillin-clavulanate (AUGMENTIN) 875-125 MG tablet  Recurrent sinusitis, suspect allergic component, now with possible secondary acute sinusitis.  -Trial of Flonase everyday, saline nasal spray, prescription for Augmentin if not improving the next day or 2. Side effects discussed.  -based on frequency of symptoms, will refer to ENT to discuss other treatment of chronic sinusitis.  -RTC precautions.  Meds ordered this encounter  Medications  . fluticasone (FLONASE) 50 MCG/ACT nasal spray    Sig: 2 spr each nostril once a day    Dispense:  16 g    Refill:  6  . amoxicillin-clavulanate (AUGMENTIN) 875-125 MG tablet    Sig: Take 1 tablet by mouth 2 (two) times daily.    Dispense:  20 tablet    Refill:  0   Patient Instructions  Saline nasal spray atleast 4 times per day, drink plenty of fluids, restart flonase and start Augmentin if not improving in next day or two.  I will refer you to ENT as discussed.  Return to the clinic or go to the nearest emergency room if any of your symptoms worsen or new symptoms occur.  Sinusitis, Adult Sinusitis is redness, soreness, and  inflammation of the paranasal sinuses. Paranasal sinuses are air pockets within the bones of your face. They are located beneath your eyes, in the middle of your forehead, and above your eyes. In healthy paranasal sinuses, mucus is able to drain out, and air is able to circulate through them by way of your nose. However, when your paranasal sinuses are inflamed, mucus and air can become trapped. This can allow bacteria and other germs to grow and cause infection. Sinusitis can develop quickly and last only a short time (acute) or continue over a long period (chronic). Sinusitis that lasts for more than 12 weeks is considered chronic. CAUSES Causes of sinusitis include:  Allergies.  Structural abnormalities, such as displacement of the cartilage that separates your nostrils (deviated septum), which can decrease the air flow through your  nose and sinuses and affect sinus drainage.  Functional abnormalities, such as when the small hairs (cilia) that line your sinuses and help remove mucus do not work properly or are not present. SIGNS AND SYMPTOMS Symptoms of acute and chronic sinusitis are the same. The primary symptoms are pain and pressure around the affected sinuses. Other symptoms include:  Upper toothache.  Earache.  Headache.  Bad breath.  Decreased sense of smell and taste.  A cough, which worsens when you are lying flat.  Fatigue.  Fever.  Thick drainage from your nose, which often is green and may contain pus (purulent).  Swelling and warmth over the affected sinuses. DIAGNOSIS Your health care provider will perform a physical exam. During your exam, your health care provider may perform any of the following to help determine if you have acute sinusitis or chronic sinusitis:  Look in your nose for signs of abnormal growths in your nostrils (nasal polyps).  Tap over the affected sinus to check for signs of infection.  View the inside of your sinuses using an imaging  device that has a light attached (endoscope). If your health care provider suspects that you have chronic sinusitis, one or more of the following tests may be recommended:  Allergy tests.  Nasal culture. A sample of mucus is taken from your nose, sent to a lab, and screened for bacteria.  Nasal cytology. A sample of mucus is taken from your nose and examined by your health care provider to determine if your sinusitis is related to an allergy. TREATMENT Most cases of acute sinusitis are related to a viral infection and will resolve on their own within 10 days. Sometimes, medicines are prescribed to help relieve symptoms of both acute and chronic sinusitis. These may include pain medicines, decongestants, nasal steroid sprays, or saline sprays. However, for sinusitis related to a bacterial infection, your health care provider will prescribe antibiotic medicines. These are medicines that will help kill the bacteria causing the infection. Rarely, sinusitis is caused by a fungal infection. In these cases, your health care provider will prescribe antifungal medicine. For some cases of chronic sinusitis, surgery is needed. Generally, these are cases in which sinusitis recurs more than 3 times per year, despite other treatments. HOME CARE INSTRUCTIONS  Drink plenty of water. Water helps thin the mucus so your sinuses can drain more easily.  Use a humidifier.  Inhale steam 3-4 times a day (for example, sit in the bathroom with the shower running).  Apply a warm, moist washcloth to your face 3-4 times a day, or as directed by your health care provider.  Use saline nasal sprays to help moisten and clean your sinuses.  Take medicines only as directed by your health care provider.  If you were prescribed either an antibiotic or antifungal medicine, finish it all even if you start to feel better. SEEK IMMEDIATE MEDICAL CARE IF:  You have increasing pain or severe headaches.  You have nausea,  vomiting, or drowsiness.  You have swelling around your face.  You have vision problems.  You have a stiff neck.  You have difficulty breathing.   This information is not intended to replace advice given to you by your health care provider. Make sure you discuss any questions you have with your health care provider.   Document Released: 08/03/2005 Document Revised: 08/24/2014 Document Reviewed: 08/18/2011 Elsevier Interactive Patient Education 2016 [employer].   Allergic Rhinitis Allergic rhinitis is when the mucous membranes in the nose respond to  allergens. Allergens are particles in the air that cause your body to have an allergic reaction. This causes you to release allergic antibodies. Through a chain of events, these eventually cause you to release histamine into the blood stream. Although meant to protect the body, it is this release of histamine that causes your discomfort, such as frequent sneezing, congestion, and an itchy, runny nose.  CAUSES Seasonal allergic rhinitis (hay fever) is caused by pollen allergens that may come from grasses, trees, and weeds. Year-round allergic rhinitis (perennial allergic rhinitis) is caused by allergens such as house dust mites, pet dander, and mold spores. SYMPTOMS  Nasal stuffiness (congestion).  Itchy, runny nose with sneezing and tearing of the eyes. DIAGNOSIS Your health care provider can help you determine the allergen or allergens that trigger your symptoms. If you and your health care provider are unable to determine the allergen, skin or blood testing may be used. Your health care provider will diagnose your condition after taking your health history and performing a physical exam. Your health care provider may assess you for other related conditions, such as asthma, pink eye, or an ear infection. TREATMENT Allergic rhinitis does not have a cure, but it can be controlled by:  Medicines that block allergy symptoms. These may include  allergy shots, nasal sprays, and oral antihistamines.  Avoiding the allergen. Hay fever may often be treated with antihistamines in pill or nasal spray forms. Antihistamines block the effects of histamine. There are over-the-counter medicines that may help with nasal congestion and swelling around the eyes. Check with your health care provider before taking or giving this medicine. If avoiding the allergen or the medicine prescribed do not work, there are many new medicines your health care provider can prescribe. Stronger medicine may be used if initial measures are ineffective. Desensitizing injections can be used if medicine and avoidance does not work. Desensitization is when a patient is given ongoing shots until the body becomes less sensitive to the allergen. Make sure you follow up with your health care provider if problems continue. HOME CARE INSTRUCTIONS It is not possible to completely avoid allergens, but you can reduce your symptoms by taking steps to limit your exposure to them. It helps to know exactly what you are allergic to so that you can avoid your specific triggers. SEEK MEDICAL CARE IF:  You have a fever.  You develop a cough that does not stop easily (persistent).  You have shortness of breath.  You start wheezing.  Symptoms interfere with normal daily activities.   This information is not intended to replace advice given to you by your health care provider. Make sure you discuss any questions you have with your health care provider.   Document Released: 04/28/2001 Document Revised: 08/24/2014 Document Reviewed: 04/10/2013 Elsevier Interactive Patient Education [employer]     I personally performed the services described in this documentation, which was scribed in my presence. The recorded information has been reviewed and considered, and addended by me as needed.

## 2015-10-30 NOTE — Patient Instructions (Signed)
Saline nasal spray atleast 4 times per day, drink plenty of fluids, restart flonase and start Augmentin if not improving in next day or two.  I will refer you to ENT as discussed.  Return to the clinic or go to the nearest emergency room if any of your symptoms worsen or new symptoms occur.  Sinusitis, Adult Sinusitis is redness, soreness, and inflammation of the paranasal sinuses. Paranasal sinuses are air pockets within the bones of your face. They are located beneath your eyes, in the middle of your forehead, and above your eyes. In healthy paranasal sinuses, mucus is able to drain out, and air is able to circulate through them by way of your nose. However, when your paranasal sinuses are inflamed, mucus and air can become trapped. This can allow bacteria and other germs to grow and cause infection. Sinusitis can develop quickly and last only a short time (acute) or continue over a long period (chronic). Sinusitis that lasts for more than 12 weeks is considered chronic. CAUSES Causes of sinusitis include:  Allergies.  Structural abnormalities, such as displacement of the cartilage that separates your nostrils (deviated septum), which can decrease the air flow through your nose and sinuses and affect sinus drainage.  Functional abnormalities, such as when the small hairs (cilia) that line your sinuses and help remove mucus do not work properly or are not present. SIGNS AND SYMPTOMS Symptoms of acute and chronic sinusitis are the same. The primary symptoms are pain and pressure around the affected sinuses. Other symptoms include:  Upper toothache.  Earache.  Headache.  Bad breath.  Decreased sense of smell and taste.  A cough, which worsens when you are lying flat.  Fatigue.  Fever.  Thick drainage from your nose, which often is green and may contain pus (purulent).  Swelling and warmth over the affected sinuses. DIAGNOSIS Your health care provider will perform a physical exam.  During your exam, your health care provider may perform any of the following to help determine if you have acute sinusitis or chronic sinusitis:  Look in your nose for signs of abnormal growths in your nostrils (nasal polyps).  Tap over the affected sinus to check for signs of infection.  View the inside of your sinuses using an imaging device that has a light attached (endoscope). If your health care provider suspects that you have chronic sinusitis, one or more of the following tests may be recommended:  Allergy tests.  Nasal culture. A sample of mucus is taken from your nose, sent to a lab, and screened for bacteria.  Nasal cytology. A sample of mucus is taken from your nose and examined by your health care provider to determine if your sinusitis is related to an allergy. TREATMENT Most cases of acute sinusitis are related to a viral infection and will resolve on their own within 10 days. Sometimes, medicines are prescribed to help relieve symptoms of both acute and chronic sinusitis. These may include pain medicines, decongestants, nasal steroid sprays, or saline sprays. However, for sinusitis related to a bacterial infection, your health care provider will prescribe antibiotic medicines. These are medicines that will help kill the bacteria causing the infection. Rarely, sinusitis is caused by a fungal infection. In these cases, your health care provider will prescribe antifungal medicine. For some cases of chronic sinusitis, surgery is needed. Generally, these are cases in which sinusitis recurs more than 3 times per year, despite other treatments. HOME CARE INSTRUCTIONS  Drink plenty of water. Water helps thin the  mucus so your sinuses can drain more easily.  Use a humidifier.  Inhale steam 3-4 times a day (for example, sit in the bathroom with the shower running).  Apply a warm, moist washcloth to your face 3-4 times a day, or as directed by your health care provider.  Use saline  nasal sprays to help moisten and clean your sinuses.  Take medicines only as directed by your health care provider.  If you were prescribed either an antibiotic or antifungal medicine, finish it all even if you start to feel better. SEEK IMMEDIATE MEDICAL CARE IF:  You have increasing pain or severe headaches.  You have nausea, vomiting, or drowsiness.  You have swelling around your face.  You have vision problems.  You have a stiff neck.  You have difficulty breathing.   This information is not intended to replace advice given to you by your health care provider. Make sure you discuss any questions you have with your health care provider.   Document Released: 08/03/2005 Document Revised: 08/24/2014 Document Reviewed: 08/18/2011 Elsevier Interactive Patient Education 2016 [employer].   Allergic Rhinitis Allergic rhinitis is when the mucous membranes in the nose respond to allergens. Allergens are particles in the air that cause your body to have an allergic reaction. This causes you to release allergic antibodies. Through a chain of events, these eventually cause you to release histamine into the blood stream. Although meant to protect the body, it is this release of histamine that causes your discomfort, such as frequent sneezing, congestion, and an itchy, runny nose.  CAUSES Seasonal allergic rhinitis (hay fever) is caused by pollen allergens that may come from grasses, trees, and weeds. Year-round allergic rhinitis (perennial allergic rhinitis) is caused by allergens such as house dust mites, pet dander, and mold spores. SYMPTOMS  Nasal stuffiness (congestion).  Itchy, runny nose with sneezing and tearing of the eyes. DIAGNOSIS Your health care provider can help you determine the allergen or allergens that trigger your symptoms. If you and your health care provider are unable to determine the allergen, skin or blood testing may be used. Your health care provider will diagnose  your condition after taking your health history and performing a physical exam. Your health care provider may assess you for other related conditions, such as asthma, pink eye, or an ear infection. TREATMENT Allergic rhinitis does not have a cure, but it can be controlled by:  Medicines that block allergy symptoms. These may include allergy shots, nasal sprays, and oral antihistamines.  Avoiding the allergen. Hay fever may often be treated with antihistamines in pill or nasal spray forms. Antihistamines block the effects of histamine. There are over-the-counter medicines that may help with nasal congestion and swelling around the eyes. Check with your health care provider before taking or giving this medicine. If avoiding the allergen or the medicine prescribed do not work, there are many new medicines your health care provider can prescribe. Stronger medicine may be used if initial measures are ineffective. Desensitizing injections can be used if medicine and avoidance does not work. Desensitization is when a patient is given ongoing shots until the body becomes less sensitive to the allergen. Make sure you follow up with your health care provider if problems continue. HOME CARE INSTRUCTIONS It is not possible to completely avoid allergens, but you can reduce your symptoms by taking steps to limit your exposure to them. It helps to know exactly what you are allergic to so that you can avoid your specific triggers.  SEEK MEDICAL CARE IF:  You have a fever.  You develop a cough that does not stop easily (persistent).  You have shortness of breath.  You start wheezing.  Symptoms interfere with normal daily activities.   This information is not intended to replace advice given to you by your health care provider. Make sure you discuss any questions you have with your health care provider.   Document Released: 04/28/2001 Document Revised: 08/24/2014 Document Reviewed: 04/10/2013 Elsevier  Interactive Patient Education [employer]

## 2015-12-23 ENCOUNTER — Encounter (HOSPITAL_COMMUNITY): Payer: Self-pay | Admitting: Emergency Medicine

## 2015-12-23 ENCOUNTER — Emergency Department (HOSPITAL_COMMUNITY): Payer: BLUE CROSS/BLUE SHIELD

## 2015-12-23 ENCOUNTER — Emergency Department (HOSPITAL_COMMUNITY)
Admission: EM | Admit: 2015-12-23 | Discharge: 2015-12-23 | Disposition: A | Discharge status: Home / Self Care | Payer: BLUE CROSS/BLUE SHIELD | Attending: Emergency Medicine | Admitting: Emergency Medicine

## 2015-12-23 DIAGNOSIS — S0121XA Laceration without foreign body of nose, initial encounter: Secondary | ICD-10-CM | POA: Insufficient documentation

## 2015-12-23 DIAGNOSIS — S01111A Laceration without foreign body of right eyelid and periocular area, initial encounter: Secondary | ICD-10-CM | POA: Diagnosis not present

## 2015-12-23 DIAGNOSIS — S0219XA Other fracture of base of skull, initial encounter for closed fracture: Secondary | ICD-10-CM | POA: Insufficient documentation

## 2015-12-23 DIAGNOSIS — S4991XA Unspecified injury of right shoulder and upper arm, initial encounter: Secondary | ICD-10-CM | POA: Insufficient documentation

## 2015-12-23 DIAGNOSIS — Z8659 Personal history of other mental and behavioral disorders: Secondary | ICD-10-CM | POA: Diagnosis not present

## 2015-12-23 DIAGNOSIS — H02402 Unspecified ptosis of left eyelid: Secondary | ICD-10-CM | POA: Insufficient documentation

## 2015-12-23 DIAGNOSIS — S060X1A Concussion with loss of consciousness of 30 minutes or less, initial encounter: Secondary | ICD-10-CM | POA: Diagnosis not present

## 2015-12-23 DIAGNOSIS — R011 Cardiac murmur, unspecified: Secondary | ICD-10-CM | POA: Diagnosis not present

## 2015-12-23 DIAGNOSIS — S0990XA Unspecified injury of head, initial encounter: Secondary | ICD-10-CM | POA: Diagnosis present

## 2015-12-23 DIAGNOSIS — S3992XA Unspecified injury of lower back, initial encounter: Secondary | ICD-10-CM | POA: Insufficient documentation

## 2015-12-23 DIAGNOSIS — S0285XA Fracture of orbit, unspecified, initial encounter for closed fracture: Secondary | ICD-10-CM

## 2015-12-23 DIAGNOSIS — Y9289 Other specified places as the place of occurrence of the external cause: Secondary | ICD-10-CM | POA: Diagnosis not present

## 2015-12-23 DIAGNOSIS — S0181XA Laceration without foreign body of other part of head, initial encounter: Secondary | ICD-10-CM

## 2015-12-23 DIAGNOSIS — Z79899 Other long term (current) drug therapy: Secondary | ICD-10-CM | POA: Insufficient documentation

## 2015-12-23 DIAGNOSIS — Y998 Other external cause status: Secondary | ICD-10-CM | POA: Diagnosis not present

## 2015-12-23 DIAGNOSIS — Z792 Long term (current) use of antibiotics: Secondary | ICD-10-CM | POA: Diagnosis not present

## 2015-12-23 DIAGNOSIS — S0282XA Fracture of other specified skull and facial bones, left side, initial encounter for closed fracture: Secondary | ICD-10-CM | POA: Diagnosis not present

## 2015-12-23 DIAGNOSIS — S29001A Unspecified injury of muscle and tendon of front wall of thorax, initial encounter: Secondary | ICD-10-CM | POA: Diagnosis not present

## 2015-12-23 DIAGNOSIS — Y9389 Activity, other specified: Secondary | ICD-10-CM | POA: Insufficient documentation

## 2015-12-23 MED ORDER — CYCLOBENZAPRINE HCL 10 MG PO TABS
5.0000 mg | ORAL_TABLET | Freq: Once | ORAL | Status: AC
  Administered 2015-12-23: 5 mg via ORAL
  Filled 2015-12-23: qty 1

## 2015-12-23 MED ORDER — ACETAMINOPHEN 325 MG PO TABS
650.0000 mg | ORAL_TABLET | Freq: Once | ORAL | Status: AC
  Administered 2015-12-23: 650 mg via ORAL
  Filled 2015-12-23: qty 2

## 2015-12-23 MED ORDER — AMOXICILLIN 500 MG PO CAPS: 500.0000 mg | ORAL_CAPSULE | Freq: Two times a day (BID) | ORAL | Status: DC

## 2015-12-23 MED ORDER — OXYCODONE-ACETAMINOPHEN 5-325 MG PO TABS: 1.0000 | ORAL_TABLET | ORAL | Status: DC | PRN

## 2015-12-23 MED ORDER — IBUPROFEN 800 MG PO TABS
800.0000 mg | ORAL_TABLET | Freq: Once | ORAL | Status: AC
  Administered 2015-12-23: 800 mg via ORAL
  Filled 2015-12-23: qty 1
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
## 2015-12-23 NOTE — Discharge Instructions (Signed)
Your CT scans from today show a depressed fracture through the anterior wall of the left frontal sinus extending into the left supraorbital rim.  Your xrays of your chest and shoulder are normal.  Please follow up with Dr. Lazarus Salines for further management of your facial bone fractures.  You may take Percocet every 4 hours for pain.  Skull Fracture, Uncomplicated, Adult You have a skull fracture. This happens when one of the bones of your head cracks or breaks. Your injury does not appear serious at this time and we feel you can be observed safely at home. An injury to the head that causes a skull fracture may also cause a concussion. With a concussion you may be knocked out for a brief moment (loss of consciousness). A concussion is the mildest form of traumatic brain injury. The symptoms of a concussion are short-lived and resolve on their own. The most common symptoms are confusion and forgetting what happened (amnesia). SYMPTOMS These minor symptoms may be experienced after discharge:  Memory difficulties.  Dizziness.  Headaches.  Hearing difficulties.  Vertigo or trouble with balance.  Depression.  Tiredness.  Difficulty with concentration.  Nausea.  Vomiting. A bruise on the brain (concussion) requires a few days for recovery the same as a bruise elsewhere on your body. It is common for people with injuries such as yours to experience such problems. Usually these problems disappear without medical care. If symptoms remain for more than a few days, notify your caregiver. See your caregiver sooner if symptoms are becoming worse rather than better. HOME CARE INSTRUCTIONS   During the next 24 hours you should stay with an adult who can watch you for the above warning signs.  This person should wake you up every 02-03 hours to check on your condition, noting any of the above signs or symptoms. Problems which are getting worse mean you should call or return immediately to the facility  where you were just seen, or to the nearest emergency department. In case of emergency or unconsciousness, call for local emergency medical help.  You should take clear liquids for the rest of the day and then resume your regular diet.  You should not take sedatives or alcoholic beverages for 48 hours after discharge.  After injuries such as yours, most serious problems happen within the first 24 hours.  If x-rays or other testing were done, make sure you know how you are to get those results. It is your responsibility to call back for results when your caregiver suggests. Do not assume everything is fine if your caregiver has not been able to reach you.  Skull fractures usually do not need follow up x-rays. These fractures are not like broken arms. The position of the skull stays the same as when it was broken and usually heals without problems.  If you have a concussion be aware that symptoms may last up to a week or longer.  It is very important to keep any follow-up appointments after a head injury.  It is unlikely that serious side effects will occur. If they do occur it is usually soon after the accident but may occur as long as a week after the accident or injury. SEEK IMMEDIATE MEDICAL CARE IF:   Confusion or drowsiness develops; children frequently become drowsy after any type of trauma or injury.  A person cannot arouse the injured person.  You feel sick to your stomach (nausea) or persistent, forceful vomiting (projectile in nature).  Rapid back and forth  movement of the eyes. This is called nystagmus.  Convulsions, seizures, or unconsciousness.  Severe persistent headaches not relieved by medication. Only take over-the-counter or prescription medicines for pain, discomfort, or fever as directed by your caregiver. (Do not take aspirin as this weakens blood clotting abilities).  Inability to use arms or legs appropriately.  Changes in pupil sizes.  Clear or bloody  discharge from nose or ears.   This information is not intended to replace advice given to you by your health care provider. Make sure you discuss any questions you have with your health care provider.   Document Released: 06/03/2004 Document Revised: 10/26/2011 Document Reviewed: 01/25/2015 Elsevier Interactive Patient Education 2016 [employer]   Facial Laceration  A facial laceration is a cut on the face. These injuries can be painful and cause bleeding. Lacerations usually heal quickly, but they need special care to reduce scarring. DIAGNOSIS  Your health care provider will take a medical history, ask for details about how the injury occurred, and examine the wound to determine how deep the cut is. TREATMENT  Some facial lacerations may not require closure. Others may not be able to be closed because of an increased risk of infection. The risk of infection and the chance for successful closure will depend on various factors, including the amount of time since the injury occurred. The wound may be cleaned to help prevent infection. If closure is appropriate, pain medicines may be given if needed. Your health care provider will use stitches (sutures), wound glue (adhesive), or skin adhesive strips to repair the laceration. These tools bring the skin edges together to allow for faster healing and a better cosmetic outcome. If needed, you may also be given a tetanus shot. HOME CARE INSTRUCTIONS  Only take over-the-counter or prescription medicines as directed by your health care provider.  Follow your health care provider's instructions for wound care. These instructions will vary depending on the technique used for closing the wound. For Sutures:  Keep the wound clean and dry.   If you were given a bandage (dressing), you should change it at least once a day. Also change the dressing if it becomes wet or dirty, or as directed by your health care provider.   Wash the wound with soap  and water 2 times a day. Rinse the wound off with water to remove all soap. Pat the wound dry with a clean towel.   After cleaning, apply a thin layer of the antibiotic ointment recommended by your health care provider. This will help prevent infection and keep the dressing from sticking.   You may shower as usual after the first 24 hours. Do not soak the wound in water until the sutures are removed.   Get your sutures removed as directed by your health care provider. With facial lacerations, sutures should usually be taken out after 4-5 days to avoid stitch marks.   Wait a few days after your sutures are removed before applying any makeup. For Skin Adhesive Strips:  Keep the wound clean and dry.   Do not get the skin adhesive strips wet. You may bathe carefully, using caution to keep the wound dry.   If the wound gets wet, pat it dry with a clean towel.   Skin adhesive strips will fall off on their own. You may trim the strips as the wound heals. Do not remove skin adhesive strips that are still stuck to the wound. They will fall off in time.  For Wound Adhesive:  You may briefly wet your wound in the shower or bath. Do not soak or scrub the wound. Do not swim. Avoid periods of heavy sweating until the skin adhesive has fallen off on its own. After showering or bathing, gently pat the wound dry with a clean towel.   Do not apply liquid medicine, cream medicine, ointment medicine, or makeup to your wound while the skin adhesive is in place. This may loosen the film before your wound is healed.   If a dressing is placed over the wound, be careful not to apply tape directly over the skin adhesive. This may cause the adhesive to be pulled off before the wound is healed.   Avoid prolonged exposure to sunlight or tanning lamps while the skin adhesive is in place.  The skin adhesive will usually remain in place for 5-10 days, then naturally fall off the skin. Do not pick at the  adhesive film.  After Healing: Once the wound has healed, cover the wound with sunscreen during the day for 1 full year. This can help minimize scarring. Exposure to ultraviolet light in the first year will darken the scar. It can take 1-2 years for the scar to lose its redness and to heal completely.  SEEK MEDICAL CARE IF:  You have a fever. SEEK IMMEDIATE MEDICAL CARE IF:  You have redness, pain, or swelling around the wound.   You see ayellowish-white fluid (pus) coming from the wound.    This information is not intended to replace advice given to you by your health care provider. Make sure you discuss any questions you have with your health care provider.   Document Released: 09/10/2004 Document Revised: 08/24/2014 Document Reviewed: 03/16/2013 Elsevier Interactive Patient Education [employer]

## 2015-12-26 ENCOUNTER — Telehealth: Payer: Self-pay | Admitting: *Deleted

## 2015-12-26 NOTE — ED Notes (Signed)
Contacted by patient requesting work note.  States that face and eyes are black and swollen [facility] and is unable to obtain appt with Dr. Lazarus SalinesWolicki until 12/31/2015.  Chart discussed with Trixie DredgeEmily West, PA-C who approved note for work until 04/01/2016.  Release from work after 04/01/2016 will need to be approved by Dr. Lazarus SalinesWolicki.

## 2016-01-16 ENCOUNTER — Encounter: Payer: Self-pay | Admitting: Neurology

## 2016-01-16 ENCOUNTER — Ambulatory Visit (INDEPENDENT_AMBULATORY_CARE_PROVIDER_SITE_OTHER): Payer: BLUE CROSS/BLUE SHIELD | Admitting: Neurology

## 2016-01-16 VITALS — BP 128/74 | HR 64 | Ht 66.0 in | Wt 167.0 lb

## 2016-01-16 DIAGNOSIS — S060X1D Concussion with loss of consciousness of 30 minutes or less, subsequent encounter: Secondary | ICD-10-CM | POA: Diagnosis not present

## 2016-01-16 DIAGNOSIS — G43909 Migraine, unspecified, not intractable, without status migrainosus: Secondary | ICD-10-CM | POA: Insufficient documentation

## 2016-01-16 DIAGNOSIS — S060X9A Concussion with loss of consciousness of unspecified duration, initial encounter: Secondary | ICD-10-CM | POA: Insufficient documentation

## 2016-01-16 DIAGNOSIS — G43009 Migraine without aura, not intractable, without status migrainosus: Secondary | ICD-10-CM | POA: Diagnosis not present

## 2016-01-16 DIAGNOSIS — S060XAA Concussion with loss of consciousness status unknown, initial encounter: Secondary | ICD-10-CM | POA: Insufficient documentation

## 2016-01-16 MED ORDER — NORTRIPTYLINE HCL 25 MG PO CAPS: ORAL_CAPSULE | ORAL | Status: DC

## 2016-01-16 MED ORDER — SUMATRIPTAN SUCCINATE 50 MG PO TABS
50.0000 mg | ORAL_TABLET | ORAL | Status: DC | PRN
Start: 2016-01-16 — End: 2016-06-30
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
## 2016-02-24 ENCOUNTER — Telehealth: Payer: Self-pay | Admitting: Neurology

## 2016-02-24 ENCOUNTER — Other Ambulatory Visit: Payer: Self-pay | Admitting: *Deleted

## 2016-02-24 DIAGNOSIS — F411 Generalized anxiety disorder: Secondary | ICD-10-CM

## 2016-02-24 NOTE — Telephone Encounter (Addendum)
Pt called in about referral for therapist regarding anxiety. May call

## 2016-02-24 NOTE — Telephone Encounter (Signed)
Ok, per vo by Dr. Terrace ArabiaYan, to place a psychiatry referral for anxiety.  Spoke to patient - he is aware that the referral has been made for him.

## 2016-03-30 ENCOUNTER — Encounter: Payer: Self-pay | Admitting: Neurology

## 2016-03-30 ENCOUNTER — Ambulatory Visit (INDEPENDENT_AMBULATORY_CARE_PROVIDER_SITE_OTHER): Payer: BLUE CROSS/BLUE SHIELD | Admitting: Neurology

## 2016-03-30 VITALS — BP 121/82 | HR 67 | Ht 66.0 in | Wt 166.8 lb

## 2016-03-30 DIAGNOSIS — G4700 Insomnia, unspecified: Secondary | ICD-10-CM

## 2016-03-30 DIAGNOSIS — S060X1D Concussion with loss of consciousness of 30 minutes or less, subsequent encounter: Secondary | ICD-10-CM | POA: Diagnosis not present

## 2016-03-30 DIAGNOSIS — F4322 Adjustment disorder with anxiety: Secondary | ICD-10-CM

## 2016-03-30 DIAGNOSIS — R0683 Snoring: Secondary | ICD-10-CM

## 2016-03-30 DIAGNOSIS — G43009 Migraine without aura, not intractable, without status migrainosus: Secondary | ICD-10-CM

## 2016-03-30 MED ORDER — NORTRIPTYLINE HCL 25 MG PO CAPS: 50.0000 mg | ORAL_CAPSULE | Freq: Every day | ORAL | 11 refills | Status: DC
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
## 2016-03-31 NOTE — Telephone Encounter (Signed)
Called and left patient a message relaying he can call and they willl see him right away left patient telephone number and I will call him back to make sure he received my message.

## 2016-04-01 NOTE — Telephone Encounter (Signed)
Sent referral to Dr Donell BeersPlovsky. Fax [phone] # [phone]  Called and left patient a message with details of apt.

## 2016-04-22 ENCOUNTER — Ambulatory Visit (INDEPENDENT_AMBULATORY_CARE_PROVIDER_SITE_OTHER): Payer: BLUE CROSS/BLUE SHIELD | Admitting: Neurology

## 2016-04-22 ENCOUNTER — Encounter: Payer: Self-pay | Admitting: Neurology

## 2016-04-22 VITALS — BP 116/72 | HR 62 | Resp 16 | Ht 66.0 in | Wt 166.0 lb

## 2016-04-22 DIAGNOSIS — G4710 Hypersomnia, unspecified: Secondary | ICD-10-CM | POA: Diagnosis not present

## 2016-04-22 DIAGNOSIS — R0683 Snoring: Secondary | ICD-10-CM

## 2016-04-22 DIAGNOSIS — E663 Overweight: Secondary | ICD-10-CM | POA: Diagnosis not present

## 2016-04-22 DIAGNOSIS — R519 Headache, unspecified: Secondary | ICD-10-CM

## 2016-04-22 DIAGNOSIS — R002 Palpitations: Secondary | ICD-10-CM | POA: Diagnosis not present

## 2016-04-22 DIAGNOSIS — R51 Headache: Secondary | ICD-10-CM

## 2016-04-22 NOTE — Progress Notes (Signed)
Subjective:    Patient ID: Billy [name] is a 35 y.o. male.  HPI     Huston FoleySaima Brandie Lopes, MD, PhD [facility] [address], [address] [address] [place], [place] [address]  Dear Vivia EwingYijun,   I saw your patient, Billy [name], upon your kind request in my clinic today for initial consultation of his sleep disturbance, concern for underlying obstructive sleep apnea. The patient is unaccompanied today. As you know, Billy [name] is a 35 year old right-handed gentleman with an underlying medical history of concussion, left frontal sinus anterior plate fracture, recurrent headaches, anxiety, allergies, and overweight state, who reports snoring and excessive daytime somnolence as well as morning headaches. He has had some difficulty maintaining sleep, falling asleep is generally speaking a problem but he has also had nighttime anxiety and has been fearful of falling asleep. He has been referred to psychiatry or psychology or both from what I understand. He has an appointment pending. He sometimes wakes up with a sense of panic and not being able to breathe as well as palpitations. He has been told that he snores. He lives alone. He has 1 child. He works as an Art gallery managerengineer. Bedtime is generally around midnight and wake up time around 8 AM. He has nocturia occasionally, not nightly. He does not drink caffeine on a daily basis, does not smoke, drinks alcohol infrequently, maybe twice a week. His ESS is 19/24, his fatigue score is 63/63.  He denies nightly nocturia, or RLS or PLMs. Mom has OSA and uses a CPAP machine.   His Past Medical History Is Significant For: Past Medical History:  Diagnosis Date  . Allergy   . Anxiety   . Facial fracture (HCC)   . Headache   . Heart murmur   . Shoulder pain, right     His Past Surgical History Is Significant For: Past Surgical History:  Procedure Laterality Date  . WISDOM TOOTH EXTRACTION      His Family History Is Significant For: Family History   Problem Relation Age of Onset  . Hypertension Mother   . Diabetes Mother   . Diabetes Maternal Grandmother   . Heart disease Maternal Grandmother   . Stroke Maternal Grandmother   . Hyperlipidemia Maternal Grandmother   . Hypertension Maternal Grandmother   . Stroke Maternal Grandfather   . Heart disease Maternal Grandfather   . Diabetes Maternal Grandfather   . Hyperlipidemia Maternal Grandfather   . Diabetes Paternal Grandmother   . Hyperlipidemia Paternal Grandmother   . Hypertension Paternal Grandmother   . Hypertension Paternal Grandfather   . Cancer Paternal Grandfather   . Healthy Father   . Kidney disease Maternal Aunt   . Heart disease Maternal Uncle     His Social History Is Significant For: Social History   Social History  . Marital status: Single    Spouse name: N/A  . Number of children: 1  . Years of education: Bachelors   Occupational History  . Engineering    Social History Main Topics  . Smoking status: Former Smoker    Packs/day: 0.50    Years: 0.50    Types: Cigarettes  . Smokeless tobacco: Never Used  . Alcohol use 1.8 - 2.4 oz/week    3 - 4 Standard drinks or equivalent per week  . Drug use: No  . Sexual activity: Yes    Partners: Female   Other Topics Concern  . None   Social History Narrative   Lives at home alone.  Right-handed.   Occasional caffeine use, 0-2 a week     His Allergies Are:  Allergies  Allergen Reactions  . Dust Mite Extract   . Molds & Smuts   :   His Current Medications Are:  Outpatient Encounter Prescriptions as of 04/22/2016  Medication Sig  . nortriptyline (PAMELOR) 25 MG capsule Take 2 capsules (50 mg total) by mouth at bedtime.  . SUMAtriptan (IMITREX) 50 MG tablet Take 1 tablet (50 mg total) by mouth every 2 (two) hours as needed for migraine. May repeat in 2 hours if headache persists or recurs.   No facility-administered encounter medications on file as of 04/22/2016.   : Review of Systems:  Out of  a complete 14 point review of systems, all are reviewed and negative with the exception of these symptoms as listed below: Review of Systems  Neurological:       Patient has never had a sleep study, has trouble staying asleep, wakes up in a panic or gasping for air, snoring, wakes up feeling tired in the morning, has morning headaches.   Epworth Sleepiness Scale 0= would never doze 1= slight chance of dozing 2= moderate chance of dozing 3= high chance of dozing  Sitting and reading: 3 Watching TV:2 Sitting inactive in a public place (ex. Theater or meeting):3 As a passenger in a car for an hour without a break:3 Lying down to rest in the afternoon:3 Sitting and talking to someone:1 Sitting quietly after lunch (no alcohol):3 In a car, while stopped in traffic:1 Total:19  Objective:  Neurologic Exam  Physical Exam Physical Examination:   Vitals:   04/22/16 1127  BP: 116/72  Pulse: 62  Resp: 16   General Examination: The patient is a very pleasant 35 y.o. male in no acute distress. He appears well-developed and well-nourished and very well groomed.   HEENT: Normocephalic, atraumatic, pupils are equal, round and reactive to light and accommodation. Funduscopic exam is normal with sharp disc margins noted. Extraocular tracking is good without limitation to gaze excursion or nystagmus noted. Normal smooth pursuit is noted. Hearing is grossly intact. Face is symmetric with normal facial animation and normal facial sensation. Speech is clear with no dysarthria noted. There is no hypophonia. There is no lip, neck/head, jaw or voice tremor. Neck is supple with full range of passive and active motion. There are no carotid bruits on auscultation. Oropharynx exam reveals: mild mouth dryness, good dental hygiene and mild to moderate airway crowding, due to smaller airway entry, larger uvula, longer tongue. Mallampati is class II. Tongue protrudes centrally and palate elevates symmetrically.  Tonsils are 1-2+. Neck size is 14 7/8 inches. He has a Mild overbite. Nasal inspection reveals no significant nasal mucosal bogginess or redness and no septal deviation.   Chest: Clear to auscultation without wheezing, rhonchi or crackles noted.  Heart: S1+S2+0, regular and normal without murmurs, rubs or gallops noted.   Abdomen: Soft, non-tender and non-distended with normal bowel sounds appreciated on auscultation.  Extremities: There is no pitting edema in the distal lower extremities bilaterally. Pedal pulses are intact.  Skin: Warm and dry without trophic changes noted. There are no varicose veins.  Musculoskeletal: exam reveals no obvious joint deformities, tenderness or joint swelling or erythema.   Neurologically:  Mental status: The patient is awake, alert and oriented in all 4 spheres. His immediate and remote memory, attention, language skills and fund of knowledge are appropriate. There is no evidence of aphasia, agnosia, apraxia or anomia. Speech is  clear with normal prosody and enunciation. Thought process is linear. Mood is normal and affect is normal.  Cranial nerves II - XII are as described above under HEENT exam. In addition: shoulder shrug is normal with equal shoulder height noted. Motor exam: Normal bulk, strength and tone is noted. There is no drift, tremor or rebound. Romberg is negative. Reflexes are 2+ throughout. Fine motor skills and coordination: intact with normal finger taps, normal hand movements, normal rapid alternating patting, normal foot taps and normal foot agility.  Cerebellar testing: No dysmetria or intention tremor on finger to nose testing. Heel to shin is unremarkable bilaterally. There is no truncal or gait ataxia.  Sensory exam: intact to light touch, pinprick, vibration, temperature in the upper and lower extremities.  Gait, station and balance: He stands easily. No veering to one side is noted. No leaning to one side is noted. Posture is  age-appropriate and stance is narrow based. Gait shows decreased stride length and normal pace. No problems turning are noted. Tandem walk is unremarkable.          Assessment and Plan:  In summary, Billy [name] is a very pleasant 35 y.o.-year old male with an underlying medical history of concussion, left frontal sinus anterior plate fracture, recurrent headaches, anxiety, allergies, and overweight state, whose history and physical exam are somewhat concerning for obstructive sleep apnea (OSA). I had a long chat with the patient about my findings and the diagnosis of OSA, its prognosis and treatment options. We talked about medical treatments, surgical interventions and non-pharmacological approaches. I explained in particular the risks and ramifications of untreated moderate to severe OSA, especially with respect to developing cardiovascular disease down the Road, including congestive heart failure, difficult to treat hypertension, cardiac arrhythmias, or stroke. Even type 2 diabetes has, in part, been linked to untreated OSA. Symptoms of untreated OSA include daytime sleepiness, memory problems, mood irritability and mood disorder such as depression and anxiety, lack of energy, as well as recurrent headaches, especially morning headaches. We talked about trying to maintain a healthy lifestyle in general, as well as the importance of weight control. I encouraged the patient to eat healthy, exercise daily and keep well hydrated, to keep a scheduled bedtime and wake time routine, to not skip any meals and eat healthy snacks in between meals. I advised the patient not to drive when feeling sleepy. I recommended the following at this time: sleep study with potential positive airway pressure titration. (We will score hypopneas at 3% and split the sleep study into diagnostic and treatment portion, if the estimated. 2 hour AHI is >15/h).   I explained the sleep test procedure to the patient and also outlined  possible surgical and non-surgical treatment options of OSA, including the use of a custom-made dental device (which would require a referral to a specialist dentist or oral surgeon), upper airway surgical options, such as pillar implants, radiofrequency surgery, tongue base surgery, and UPPP (which would involve a referral to an ENT surgeon). Rarely, jaw surgery such as mandibular advancement may be considered.  I also explained the CPAP treatment option to the patient, who indicated that he would be willing to try CPAP if the need arises. I explained the importance of being compliant with PAP treatment, not only for insurance purposes but primarily to improve His symptoms, and for the patient's long term health benefit, including to reduce His cardiovascular risks. I answered all his questions today and the patient was in agreement. I would like to  see him back after the sleep study is completed and encouraged him to call with any interim questions, concerns, problems or updates.   Thank you very much for allowing me to participate in the care of this nice patient. If I can be of any further assistance to you please do not hesitate to talk to me.   Sincerely,   Huston Foley, MD, PhD

## 2016-04-22 NOTE — Patient Instructions (Signed)

## 2016-05-19 ENCOUNTER — Ambulatory Visit (INDEPENDENT_AMBULATORY_CARE_PROVIDER_SITE_OTHER): Payer: BLUE CROSS/BLUE SHIELD | Admitting: Neurology

## 2016-05-19 DIAGNOSIS — G4720 Circadian rhythm sleep disorder, unspecified type: Secondary | ICD-10-CM

## 2016-05-19 DIAGNOSIS — G4710 Hypersomnia, unspecified: Secondary | ICD-10-CM

## 2016-05-29 ENCOUNTER — Telehealth: Payer: Self-pay | Admitting: Neurology

## 2016-05-29 NOTE — Progress Notes (Signed)
PATIENT'S NAME:  Billy [name], Billy [name] DOB:      11/24/1980      MR#:    [id number]     DATE OF RECORDING: 05/19/2016 REFERRING M.D.:  Levert Feinstein, MD, PhD, PCP: Everrett Coombe, DO Study Performed:   Baseline Polysomnogram HISTORY: 35 year old with an underlying medical history of concussion, left frontal sinus anterior plate fracture, recurrent headaches, anxiety, allergies, and overweight state, who reports snoring and excessive daytime somnolence as well as morning headaches. His ESS is 19/24, his fatigue score is 63/63.  The patient's weight 166 pounds with a height of 66 (inches), resulting in a BMI of 26.6 kg/m2. The patient's neck circumference measured 15 inches.  CURRENT MEDICATIONS: Nortriptyline and Sumatriptan   PROCEDURE:  This is a multichannel digital polysomnogram utilizing the Somnostar 11.2 system.  Electrodes and sensors were applied and monitored per AASM Specifications.   EEG, EOG, Chin and Limb EMG, were sampled at 200 Hz.  ECG, Snore and Nasal Pressure, Thermal Airflow, Respiratory Effort, CPAP Flow and Pressure, Oximetry was sampled at 50 Hz. Digital video and audio were recorded.      BASELINE STUDY  Lights Out was at 22:52 and Lights On at 05:07.  Total recording time (TRT) was 375.5, with a total sleep time (TST) of 256 minutes.   The patient's sleep latency was 80.5 minutes, which is delayed.  REM latency was 151.5 minutes, which is delayed.  The sleep efficiency was 68.2 %, which is reduced.     SLEEP ARCHITECTURE: Stage N1 7.%, Stage N2 73.4%, increased, absent Stage N3 and near-normal Stage R (REM sleep) 19.5%.   RESPIRATORY ANALYSIS:  There was a total of 5 respiratory events:  0 obstructive apneas, 0 central apneas and0 mixed apneas with a total of 0 apneas and an apnea index (AI) of 0. There were 5 hypopneas with a hypopnea index of 1.2. The patient also had 0 respiratory event related arousals (RERAs).      The total APNEA/HYPOPNEA INDEX (AHI) was 1.2 and the total  RESPIRATORY DISTURBANCE INDEX was 1.2.  3 events occurred in REM sleep and 4 events in NREM. The REM AHI was 3.6, versus a non-REM AHI of .6. The patient spent 44% of total sleep time in the supine position. The supine AHI was 1.6 versus a non-supine AHI of 0.8.  Very mild intermittent snoring was noted.  OXYGEN SATURATION & C02:  The baseline 02 saturation was 98%, with the lowest being 92%. Time spent below 89% saturation equaled 0 minutes.  EKG showed NSR.  The video and audio analysis did not show any abnormal or unusual behaviors, movements, phonations or vocalizations.  PERIODIC LIMB MOVEMENTS:   The patient had a total of 0 Periodic Limb Movements.  The Periodic Limb Movement (PLM) index was 0 and the PLM Arousal index was 0.   IMPRESSION:  1. Dysfunctions associated with sleep stages or arousal from sleep   RECOMMENDATIONS:  1.  This study shows sleep fragmentation and abnormal sleep stage percentages; these are nonspecific findings and per se do not signify an intrinsic sleep disorder or a cause for the patient's sleep-related symptoms. Causes include (but are not limited to) the first night effect of the sleep study, circadian rhythm disturbances, medication effect or an underlying mood disorder or medical problem.   2. This study does not demonstrate any significant obstructive or central sleep disordered breathing or parasomnias or PLMD. This study does not support an intrinsic sleep disorder as a cause of the patient's symptoms,  including his daytime somnolence. Other causes, including circadian rhythm disturbances, an underlying mood disorder, medication effect and/or an underlying medical problem cannot be ruled out. The patient should be cautioned not to drive, work at heights, or operate dangerous or heavy equipment when tired or sleepy. Review and reiteration of good sleep hygiene measures should be pursued with any patient.  3. A follow up appointment in sleep clinic will be  offered to the patient. His referring provider and PCP will be notified of the test results.       I certify that I have reviewed the entire raw data recording prior to the issuance of this report in accordance with the Standards of Accreditation of the American Academy of Sleep Medicine (AASM)       Huston FoleySaima Sheriece Jefcoat, MD, PhD Diplomat, American Board of Psychiatry and Neurology  Diplomat, American Board of Sleep Medicine

## 2016-05-29 NOTE — Telephone Encounter (Signed)
Patient referred by Dr. Terrace ArabiaYan, seen by me on 04/22/16, diagnostic PSG on 05/19/16.   Please call and notify the patient that the recent sleep study did not show any significant obstructive sleep apnea or an intrinsic sleep disorder. Please inform patient that we can go over the details of the study during a follow up appointment, if desired.   Please remind patient to try to maintain good sleep hygiene, which means: Keep a regular sleep and wake schedule, try not to exercise or have a meal within 2 hours of your bedtime, try to keep your bedroom conducive for sleep, that is, cool and dark, without light distractors such as an illuminated alarm clock, and refrain from watching TV right before sleep or in the middle of the night and do not keep the TV or radio on during the night. Also, try not to use or play on electronic devices at bedtime, such as your cell phone, tablet PC or laptop. If you like to read at bedtime on an electronic device, try to dim the background light as much as possible. Do not eat in the middle of the night.   Arrange a followup appointment. Also, route or fax report to PCP and referring MD, if other than PCP.  Once you have spoken to patient, you can close this encounter.   Thanks,  Huston FoleySaima Abimelec Grochowski, MD, PhD [facility])

## 2016-06-01 NOTE — Telephone Encounter (Signed)
I spoke to patient and he is aware of results and recommendatoins. I sent copy of report to PCP. I will call back paitne for appt, per pt request.

## 2016-06-15 NOTE — Telephone Encounter (Signed)
I called patient back and was able to get him in for f/u visit this week.

## 2016-06-18 ENCOUNTER — Ambulatory Visit: Payer: Self-pay | Admitting: Neurology

## 2016-06-18 ENCOUNTER — Telehealth: Payer: Self-pay

## 2016-06-18 NOTE — Telephone Encounter (Signed)
Patient did not show to appt.  

## 2016-06-30 ENCOUNTER — Ambulatory Visit (INDEPENDENT_AMBULATORY_CARE_PROVIDER_SITE_OTHER): Payer: BLUE CROSS/BLUE SHIELD | Admitting: Nurse Practitioner

## 2016-06-30 ENCOUNTER — Encounter: Payer: Self-pay | Admitting: Nurse Practitioner

## 2016-06-30 VITALS — BP 112/76 | HR 68 | Ht 66.0 in | Wt 163.4 lb

## 2016-06-30 DIAGNOSIS — R200 Anesthesia of skin: Secondary | ICD-10-CM | POA: Insufficient documentation

## 2016-06-30 DIAGNOSIS — R208 Other disturbances of skin sensation: Secondary | ICD-10-CM | POA: Diagnosis not present

## 2016-06-30 DIAGNOSIS — G43009 Migraine without aura, not intractable, without status migrainosus: Secondary | ICD-10-CM | POA: Diagnosis not present

## 2016-06-30 MED ORDER — SUMATRIPTAN SUCCINATE 50 MG PO TABS
50.0000 mg | ORAL_TABLET | ORAL | 6 refills | Status: DC | PRN
Start: 2016-06-30 — End: 2016-12-02

## 2016-06-30 NOTE — Patient Instructions (Addendum)
Continue nortriptyline at current dose does not need refills Continue Imitrex acutely will refill Migraine tracker APP to record migraines Given a list of food triggers Given list of suggestions for sleep hygiene Will do EMG nerve conduction lower extremities for  left foot numbness Follow-up with Dr. Terrace ArabiaYan in 3 months
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
## 2016-07-01 NOTE — Progress Notes (Signed)
I have reviewed and agreed above plan. 

## 2016-08-28 ENCOUNTER — Ambulatory Visit (INDEPENDENT_AMBULATORY_CARE_PROVIDER_SITE_OTHER): Payer: BLUE CROSS/BLUE SHIELD | Admitting: Neurology

## 2016-08-28 ENCOUNTER — Encounter (INDEPENDENT_AMBULATORY_CARE_PROVIDER_SITE_OTHER): Payer: Self-pay | Admitting: Neurology

## 2016-08-28 DIAGNOSIS — R208 Other disturbances of skin sensation: Secondary | ICD-10-CM

## 2016-08-28 DIAGNOSIS — Z0289 Encounter for other administrative examinations: Secondary | ICD-10-CM

## 2016-08-28 DIAGNOSIS — R200 Anesthesia of skin: Secondary | ICD-10-CM

## 2016-08-28 NOTE — Procedures (Signed)
        Full Name: Billy [name] Gender: Male MRN #: [id number] Date of Birth: 09/07/1980    Visit Date: 08/28/2016 11:24 Age: [age] Years 4 Months Old History:  36 year old male with intermittent right toe numbness  Summary of test: Nerve conduction study: Right sural, superficial peroneal sensory responses were normal. Right tibial, peroneal to EDB motor responses were normal.  Electromyography: Selected needle examination of right lower extremity muscles right lumbosacral paraspinal muscles was normal.   Conclusion: This is a normal study, there is no electrodiagnostic evidence of right lower extremity neuropathy or right lumbosacral radiculopathy.    ------------------------------- Levert FeinsteinYijun Maksym Pfiffner, M.D.  [facility] [address] [place], [place] [address] Tel: [phone] Fax: [phone]        EMG full       EMG Summary Table    Spontaneous MUAP Recruitment  Muscle IA Fib PSW Fasc Other Amp Dur. Poly Pattern  R. Abductor hallucis Normal None None None _______ Normal Normal Normal Normal  R. Tibialis anterior Normal None None None _______ Normal Normal Normal Normal  R. Gastrocnemius (Medial head) Normal None None None _______ Normal Normal Normal Normal  R. Vastus lateralis Normal None None None _______ Normal Normal Normal Normal  R. Gluteus medius Normal None None None _______ Normal Normal Normal Normal  R. Lumbar paraspinals (mid) Normal None None None _______ Normal Normal Normal Normal  R. Lumbar paraspinals (low) Normal None None None _______ Normal Normal Normal Normal

## 2016-09-01 ENCOUNTER — Telehealth: Payer: Self-pay | Admitting: *Deleted

## 2016-09-01 NOTE — Telephone Encounter (Signed)
-----   Message from Nilda RiggsNancy Carolyn Martin, NP sent at 08/31/2016  8:02 AM EST ----- EMG/[place] normal please call

## 2016-09-01 NOTE — Telephone Encounter (Signed)
Spoke to pt and relayed that [place]/EMG study was normal.  He verbalized understanding.

## 2016-10-01 ENCOUNTER — Telehealth: Payer: Self-pay | Admitting: *Deleted

## 2016-10-01 ENCOUNTER — Ambulatory Visit: Payer: BLUE CROSS/BLUE SHIELD | Admitting: Neurology

## 2016-10-01 NOTE — Telephone Encounter (Signed)
No showed follow up appointment. 

## 2016-10-02 ENCOUNTER — Encounter: Payer: Self-pay | Admitting: Neurology

## 2016-12-02 ENCOUNTER — Encounter: Payer: Self-pay | Admitting: Neurology

## 2016-12-02 ENCOUNTER — Ambulatory Visit (INDEPENDENT_AMBULATORY_CARE_PROVIDER_SITE_OTHER): Payer: BLUE CROSS/BLUE SHIELD | Admitting: Neurology

## 2016-12-02 VITALS — BP 114/68 | HR 80 | Ht 66.0 in | Wt 161.0 lb

## 2016-12-02 DIAGNOSIS — G43009 Migraine without aura, not intractable, without status migrainosus: Secondary | ICD-10-CM | POA: Diagnosis not present

## 2016-12-02 DIAGNOSIS — F419 Anxiety disorder, unspecified: Secondary | ICD-10-CM

## 2016-12-02 DIAGNOSIS — F5104 Psychophysiologic insomnia: Secondary | ICD-10-CM | POA: Diagnosis not present
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
## 2016-12-02 MED ORDER — PROPRANOLOL HCL 60 MG PO TABS: 60.0000 mg | ORAL_TABLET | Freq: Two times a day (BID) | ORAL | 11 refills | Status: DC

## 2016-12-02 MED ORDER — SUMATRIPTAN SUCCINATE 50 MG PO TABS: 50.0000 mg | ORAL_TABLET | ORAL | 11 refills | Status: DC | PRN

## 2016-12-02 MED ORDER — NORTRIPTYLINE HCL 50 MG PO CAPS: 100.0000 mg | ORAL_CAPSULE | Freq: Every day | ORAL | 4 refills | Status: AC
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
## 2016-12-03 ENCOUNTER — Other Ambulatory Visit: Payer: Self-pay | Admitting: Physician Assistant

## 2016-12-03 DIAGNOSIS — J329 Chronic sinusitis, unspecified: Secondary | ICD-10-CM

## 2017-06-02 ENCOUNTER — Ambulatory Visit: Payer: BLUE CROSS/BLUE SHIELD | Admitting: Nurse Practitioner
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
## 2017-06-03 ENCOUNTER — Encounter: Payer: Self-pay | Admitting: Nurse Practitioner

## 2017-11-24 ENCOUNTER — Encounter: Payer: Self-pay | Admitting: Physician Assistant

## 2018-12-01 ENCOUNTER — Telehealth: Payer: Self-pay

## 2018-12-01 NOTE — Telephone Encounter (Signed)
I called and spoke to patient about converting his appointment to a virtual visit Due to current COVID 19 pandemic, our office is severely reducing in office visits, in order to minimize the risk to our patients and healthcare providers. Patient is a little hesitant about this because he feels that a virtual visit is not sufficient enough for his current issues. I explained to him that if Dr. Terrace Arabia could do the virtual visit and then if she felt that she needed to see him in the office, she would advise that day and we could reschedule. He stated that he would like to think about it over the weekend and contact us back next week. I have removed him from the schedule at this time but he can be put back on any day/time that works best for him.

## 2018-12-07 ENCOUNTER — Ambulatory Visit: Payer: BLUE CROSS/BLUE SHIELD | Admitting: Neurology

## 2018-12-07 ENCOUNTER — Encounter

## 2019-03-08 ENCOUNTER — Other Ambulatory Visit: Payer: Self-pay

## 2019-03-08 DIAGNOSIS — Z20822 Contact with and (suspected) exposure to covid-19: Secondary | ICD-10-CM

## 2019-03-12 LAB — NOVEL CORONAVIRUS, NAA: SARS-CoV-2, NAA: NOT DETECTED

## 2019-05-19 ENCOUNTER — Emergency Department (HOSPITAL_COMMUNITY)
Admission: EM | Admit: 2019-05-19 | Discharge: 2019-05-19 | Disposition: A | Discharge status: Home / Self Care | Payer: BC Managed Care – PPO | Attending: Emergency Medicine | Admitting: Emergency Medicine

## 2019-05-19 ENCOUNTER — Other Ambulatory Visit: Payer: Self-pay

## 2019-05-19 ENCOUNTER — Ambulatory Visit (HOSPITAL_COMMUNITY)
Admission: EM | Admit: 2019-05-19 | Discharge: 2019-05-19 | Disposition: A | Discharge status: Home / Self Care | Payer: No Typology Code available for payment source | Source: Ambulatory Visit | Attending: Emergency Medicine | Admitting: Emergency Medicine

## 2019-05-19 DIAGNOSIS — Z0441 Encounter for examination and observation following alleged adult rape: Secondary | ICD-10-CM | POA: Diagnosis present

## 2019-05-19 DIAGNOSIS — Z79899 Other long term (current) drug therapy: Secondary | ICD-10-CM | POA: Insufficient documentation

## 2019-05-19 DIAGNOSIS — T7621XA Adult sexual abuse, suspected, initial encounter: Secondary | ICD-10-CM | POA: Insufficient documentation

## 2019-05-19 DIAGNOSIS — Z87891 Personal history of nicotine dependence: Secondary | ICD-10-CM | POA: Insufficient documentation

## 2019-05-19 DIAGNOSIS — Z114 Encounter for screening for human immunodeficiency virus [HIV]: Secondary | ICD-10-CM | POA: Diagnosis not present

## 2019-05-19 DIAGNOSIS — Z113 Encounter for screening for infections with a predominantly sexual mode of transmission: Secondary | ICD-10-CM | POA: Insufficient documentation

## 2019-05-19 LAB — HIV ANTIBODY (ROUTINE TESTING W REFLEX): HIV Screen 4th Generation wRfx: NONREACTIVE

## 2019-05-19 LAB — RAPID HIV SCREEN (HIV 1/2 AB+AG)
HIV 1/2 Antibodies: NONREACTIVE
HIV-1 P24 Antigen - HIV24: NONREACTIVE

## 2019-05-19 MED ORDER — LIDOCAINE HCL (PF) 1 % IJ SOLN
0.9000 mL | Freq: Once | INTRAMUSCULAR | Status: AC
  Administered 2019-05-19: 0.9 mL

## 2019-05-19 MED ORDER — CEFTRIAXONE SODIUM 250 MG IJ SOLR
250.0000 mg | Freq: Once | INTRAMUSCULAR | Status: AC
  Administered 2019-05-19: 250 mg via INTRAMUSCULAR

## 2019-05-19 MED ORDER — AZITHROMYCIN 250 MG PO TABS
1000.0000 mg | ORAL_TABLET | Freq: Once | ORAL | Status: AC
Start: 2019-05-19 — End: 2019-05-19
  Administered 2019-05-19: 1000 mg via ORAL

## 2019-05-19 MED ORDER — PROMETHAZINE HCL 25 MG PO TABS
25.0000 mg | ORAL_TABLET | Freq: Four times a day (QID) | ORAL | Status: DC | PRN
  Administered 2019-05-19: 25 mg via ORAL
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
## 2019-05-19 NOTE — ED Notes (Signed)
Discharge instructions discussed with Pt. Pt verbalized understanding. Pt stable and ambulatory.    
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
## 2019-05-20 LAB — RPR: RPR Ser Ql: NONREACTIVE

## 2019-05-23 LAB — GC/CHLAMYDIA PROBE AMP (~~LOC~~) NOT AT ARMC
Chlamydia: NEGATIVE
Neisseria Gonorrhea: NEGATIVE

## 2019-10-03 ENCOUNTER — Ambulatory Visit: Payer: BC Managed Care – PPO | Attending: Internal Medicine

## 2020-12-13 ENCOUNTER — Other Ambulatory Visit: Payer: Self-pay

## 2020-12-13 ENCOUNTER — Emergency Department (HOSPITAL_COMMUNITY)
Admission: EM | Admit: 2020-12-13 | Discharge: 2020-12-14 | Disposition: A | Discharge status: Home / Self Care | Payer: Self-pay | Attending: Emergency Medicine | Admitting: Emergency Medicine

## 2020-12-13 ENCOUNTER — Encounter (HOSPITAL_COMMUNITY): Payer: Self-pay

## 2020-12-13 ENCOUNTER — Emergency Department (HOSPITAL_COMMUNITY): Payer: Self-pay

## 2020-12-13 DIAGNOSIS — R0789 Other chest pain: Secondary | ICD-10-CM | POA: Insufficient documentation

## 2020-12-13 DIAGNOSIS — R0981 Nasal congestion: Secondary | ICD-10-CM | POA: Insufficient documentation

## 2020-12-13 DIAGNOSIS — Z91040 Latex allergy status: Secondary | ICD-10-CM | POA: Insufficient documentation

## 2020-12-13 DIAGNOSIS — R0600 Dyspnea, unspecified: Secondary | ICD-10-CM | POA: Insufficient documentation

## 2020-12-13 DIAGNOSIS — J029 Acute pharyngitis, unspecified: Secondary | ICD-10-CM | POA: Insufficient documentation

## 2020-12-13 DIAGNOSIS — R062 Wheezing: Secondary | ICD-10-CM | POA: Insufficient documentation

## 2020-12-13 DIAGNOSIS — Z20822 Contact with and (suspected) exposure to covid-19: Secondary | ICD-10-CM | POA: Insufficient documentation

## 2020-12-13 DIAGNOSIS — R059 Cough, unspecified: Secondary | ICD-10-CM | POA: Insufficient documentation

## 2020-12-13 DIAGNOSIS — Z87891 Personal history of nicotine dependence: Secondary | ICD-10-CM | POA: Insufficient documentation

## 2020-12-13 NOTE — ED Triage Notes (Signed)
Pt reports nasal congestion, cough, shob, and headache since last Wednesday.

## 2020-12-14 ENCOUNTER — Encounter (HOSPITAL_COMMUNITY): Payer: Self-pay | Admitting: Student

## 2020-12-14 LAB — RESP PANEL BY RT-PCR (FLU A&B, COVID) ARPGX2
Influenza A by PCR: NEGATIVE
Influenza B by PCR: NEGATIVE
SARS Coronavirus 2 by RT PCR: NEGATIVE

## 2020-12-14 MED ORDER — ALBUTEROL SULFATE HFA 108 (90 BASE) MCG/ACT IN AERS
2.0000 | INHALATION_SPRAY | Freq: Once | RESPIRATORY_TRACT | Status: AC
  Administered 2020-12-14: 2 via RESPIRATORY_TRACT
  Filled 2020-12-14: qty 6.7

## 2020-12-14 MED ORDER — FLUTICASONE PROPIONATE 50 MCG/ACT NA SUSP: 1.0000 | Freq: Every day | NASAL | 0 refills | Status: AC

## 2020-12-14 MED ORDER — CETIRIZINE HCL 10 MG PO TABS: 10.0000 mg | ORAL_TABLET | Freq: Every day | ORAL | 0 refills | Status: DC

## 2020-12-14 MED ORDER — DEXAMETHASONE SODIUM PHOSPHATE 10 MG/ML IJ SOLN
10.0000 mg | Freq: Once | INTRAMUSCULAR | Status: AC
  Administered 2020-12-14: 10 mg via INTRAMUSCULAR
  Filled 2020-12-14: qty 1

## 2020-12-14 MED ORDER — AEROCHAMBER Z-STAT PLUS/MEDIUM MISC
1.0000 | Freq: Once | Status: AC
  Administered 2020-12-14: 1
  Filled 2020-12-14: qty 1

## 2020-12-14 NOTE — Discharge Instructions (Addendum)
You were seen in the emergency department today for a cough. Your xray and ekg were reassuring.   Suspect your symptoms are related to allergies or a virus.  Your COVID test and flu test are pending, you may see the results in MyChart.  If your COVID test is positive please be sure to follow current CDC isolation guidelines.   You were given a shot of Decadron in the ER which is a steroid to help with your symptoms.  We are sending you home with Flonase use 1 spray per nostril daily to help with congestion as well as Zyrtec to take daily as needed for cough and congestion.  Please use inhaler 1 to 2 puffs every 4-6 hours as needed for wheezing/shortness of breath.  Please take the Tessalon as prescribed by your primary care provider.  We have prescribed you new medication(s) today. Discuss the medications prescribed today with your pharmacist as they can have adverse effects and interactions with your other medicines including over the counter and prescribed medications. Seek medical evaluation if you start to experience new or abnormal symptoms after taking one of these medicines, seek care immediately if you start to experience difficulty breathing, feeling of your throat closing, facial swelling, or rash as these could be indications of a more serious allergic reaction  Follow-up with primary care within 1 week.  Return to the ER for new or worsening symptoms including but not limited to new or worsening pain, increased trouble breathing, fever, passing out, coughing up blood, or any other concerns.
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
## 2020-12-14 NOTE — ED Notes (Signed)
Pt discharged from this ED in stable condition at this time. All discharge instructions and follow up care reviewed with pt with no further questions at this time. Pt ambulatory with steady gait, clear speech.  

## 2021-04-29 ENCOUNTER — Emergency Department (HOSPITAL_BASED_OUTPATIENT_CLINIC_OR_DEPARTMENT_OTHER)
Admission: EM | Admit: 2021-04-29 | Discharge: 2021-04-29 | Disposition: A | Discharge status: Home / Self Care | Payer: Self-pay | Attending: Emergency Medicine | Admitting: Emergency Medicine

## 2021-04-29 ENCOUNTER — Emergency Department (HOSPITAL_BASED_OUTPATIENT_CLINIC_OR_DEPARTMENT_OTHER): Payer: Self-pay

## 2021-04-29 ENCOUNTER — Encounter (HOSPITAL_BASED_OUTPATIENT_CLINIC_OR_DEPARTMENT_OTHER): Payer: Self-pay | Admitting: Emergency Medicine

## 2021-04-29 ENCOUNTER — Other Ambulatory Visit: Payer: Self-pay

## 2021-04-29 DIAGNOSIS — R0602 Shortness of breath: Secondary | ICD-10-CM | POA: Insufficient documentation

## 2021-04-29 DIAGNOSIS — F419 Anxiety disorder, unspecified: Secondary | ICD-10-CM | POA: Insufficient documentation

## 2021-04-29 DIAGNOSIS — R0789 Other chest pain: Secondary | ICD-10-CM | POA: Insufficient documentation

## 2021-04-29 DIAGNOSIS — R200 Anesthesia of skin: Secondary | ICD-10-CM | POA: Insufficient documentation

## 2021-04-29 DIAGNOSIS — Z79899 Other long term (current) drug therapy: Secondary | ICD-10-CM | POA: Insufficient documentation

## 2021-04-29 DIAGNOSIS — Z91040 Latex allergy status: Secondary | ICD-10-CM | POA: Insufficient documentation

## 2021-04-29 DIAGNOSIS — Z87891 Personal history of nicotine dependence: Secondary | ICD-10-CM | POA: Insufficient documentation

## 2021-04-29 LAB — CBC WITH DIFFERENTIAL/PLATELET
Abs Immature Granulocytes: 0.02 10*3/uL (ref 0.00–0.07)
Basophils Absolute: 0 10*3/uL (ref 0.0–0.1)
Basophils Relative: 1 %
Eosinophils Absolute: 0.2 10*3/uL (ref 0.0–0.5)
Eosinophils Relative: 3 %
HCT: 40.8 % (ref 39.0–52.0)
Hemoglobin: 14.6 g/dL (ref 13.0–17.0)
Immature Granulocytes: 0 %
Lymphocytes Relative: 27 %
Lymphs Abs: 2 10*3/uL (ref 0.7–4.0)
MCH: 31.5 pg (ref 26.0–34.0)
MCHC: 35.8 g/dL (ref 30.0–36.0)
MCV: 88.1 fL (ref 80.0–100.0)
Monocytes Absolute: 0.6 10*3/uL (ref 0.1–1.0)
Monocytes Relative: 8 %
Neutro Abs: 4.5 10*3/uL (ref 1.7–7.7)
Neutrophils Relative %: 61 %
Platelets: 143 10*3/uL — ABNORMAL LOW (ref 150–400)
RBC: 4.63 MIL/uL (ref 4.22–5.81)
RDW: 12.9 % (ref 11.5–15.5)
WBC: 7.4 10*3/uL (ref 4.0–10.5)
nRBC: 0 % (ref 0.0–0.2)

## 2021-04-29 LAB — TROPONIN I (HIGH SENSITIVITY): Troponin I (High Sensitivity): 3 ng/L (ref <18)

## 2021-04-29 LAB — BASIC METABOLIC PANEL
Anion gap: 10 (ref 5–15)
BUN: 12 mg/dL (ref 6–20)
CO2: 26 mmol/L (ref 22–32)
Calcium: 9.6 mg/dL (ref 8.9–10.3)
Chloride: 104 mmol/L (ref 98–111)
Creatinine, Ser: 1.02 mg/dL (ref 0.61–1.24)
GFR, Estimated: >60 mL/min (ref >60)
Glucose, Bld: 86 mg/dL (ref 70–99)
Potassium: 3.3 mmol/L — ABNORMAL LOW (ref 3.5–5.1)
Sodium: 140 mmol/L (ref 135–145)

## 2021-04-29 LAB — D-DIMER, QUANTITATIVE: D-Dimer, Quant: 0.48 ug/mL-FEU (ref 0.00–0.50)

## 2021-04-29 LAB — BRAIN NATRIURETIC PEPTIDE: B Natriuretic Peptide: 7.9 pg/mL (ref 0.0–100.0)

## 2021-04-29 NOTE — ED Notes (Signed)
Second Troponin does not need to be collected per MD for Disposition.

## 2021-04-29 NOTE — ED Provider Notes (Signed)
[facility] EMERGENCY DEPT Provider Note   CSN: [id number] Arrival date & time: 04/29/21  0867     History Chief Complaint  Patient presents with   Chest Pain    Billy [name] is a 40 y.o. male.  Patient presents to the emergency department for evaluation of chest pain and shortness of breath.  Patient reports that he was trying to go to sleep around midnight when symptoms began.  Patient reports that he would start to nod off and then jump up suddenly.  When this would occur it felt like he was gasping for air.  He then developed some discomfort in the chest.  Ultimately he noticed that he was experiencing numbness in both of his hands as well.      Past Medical History:  Diagnosis Date   Allergy    Anxiety    Facial fracture (HCC)    Headache    Heart murmur    Shoulder pain, right     Patient Active Problem List   Diagnosis Date Noted   Chronic insomnia 12/02/2016   Numbness of left foot 06/30/2016   Concussion 01/16/2016   Migraine 01/16/2016   Allergy    Anxiety     Past Surgical History:  Procedure Laterality Date   WISDOM TOOTH EXTRACTION         Family History  Problem Relation Age of Onset   Hypertension Mother    Diabetes Mother    Diabetes Maternal Grandmother    Heart disease Maternal Grandmother    Stroke Maternal Grandmother    Hyperlipidemia Maternal Grandmother    Hypertension Maternal Grandmother    Stroke Maternal Grandfather    Heart disease Maternal Grandfather    Diabetes Maternal Grandfather    Hyperlipidemia Maternal Grandfather    Diabetes Paternal Grandmother    Hyperlipidemia Paternal Grandmother    Hypertension Paternal Grandmother    Hypertension Paternal Grandfather    Cancer Paternal Grandfather    Healthy Father    Kidney disease Maternal Aunt    Heart disease Maternal Uncle     Social History   Tobacco Use   Smoking status: Former    Packs/day: 0.50    Years: 0.50    Pack years: 0.25    Types:  Cigarettes   Smokeless tobacco: Never  Substance Use Topics   Alcohol use: Yes    Alcohol/week: 3.0 - 4.0 standard drinks    Types: 3 - 4 Standard drinks or equivalent per week   Drug use: Not Currently    Types: Marijuana    Home Medications Prior to Admission medications   Medication Sig Start Date End Date Taking? Authorizing Provider  ALPRAZolam Prudy Feeler) 1 MG tablet Take 1 mg by mouth daily as needed for anxiety. 05/10/19   [provider]  cetirizine (ZYRTEC ALLERGY) 10 MG tablet Take 1 tablet (10 mg total) by mouth daily. 12/14/20   Petrucelli, Samantha R, PA-C  fluticasone (FLONASE) 50 MCG/ACT nasal spray Place 1 spray into both nostrils daily. 12/14/20   Petrucelli, Samantha R, PA-C  nortriptyline (PAMELOR) 50 MG capsule Take 2 capsules (100 mg total) by mouth at bedtime. 12/02/16   Levert Feinstein, MD  PRESCRIPTION MEDICATION Take 1 tablet by mouth at bedtime as needed (sleep).    [provider]    Allergies    Dust mite extract, Other, Latex, and Molds & smuts  Review of Systems   Review of Systems  Respiratory:  Positive for shortness of breath.   Cardiovascular:  Positive for chest pain.  All other systems reviewed and are negative.  Physical Exam Updated Vital Signs BP 116/86 (BP Location: Right Arm)   Pulse 61   Temp 98.2 F (36.8 C) (Oral)   Resp 16   Ht 5\' 6"  (1.676 m)   Wt 81.6 kg   SpO2 98%   BMI 29.05 kg/m   Physical Exam Vitals and nursing note reviewed.  Constitutional:      General: He is not in acute distress.    Appearance: Normal appearance. He is well-developed.  HENT:     Head: Normocephalic and atraumatic.     Right Ear: Hearing normal.     Left Ear: Hearing normal.     Nose: Nose normal.  Eyes:     Conjunctiva/sclera: Conjunctivae normal.     Pupils: Pupils are equal, round, and reactive to light.  Cardiovascular:     Rate and Rhythm: Regular rhythm.     Heart sounds: S1 normal and S2 normal. No murmur heard.   No  friction rub. No gallop.  Pulmonary:     Effort: Pulmonary effort is normal. No respiratory distress.     Breath sounds: Normal breath sounds.  Chest:     Chest wall: No tenderness.  Abdominal:     General: Bowel sounds are normal.     Palpations: Abdomen is soft.     Tenderness: There is no abdominal tenderness. There is no guarding or rebound. Negative signs include Murphy's sign and McBurney's sign.     Hernia: No hernia is present.  Musculoskeletal:        General: Normal range of motion.     Cervical back: Normal range of motion and neck supple.  Skin:    General: Skin is warm and dry.     Findings: No rash.  Neurological:     Mental Status: He is alert and oriented to person, place, and time.     GCS: GCS eye subscore is 4. GCS verbal subscore is 5. GCS motor subscore is 6.     Cranial Nerves: No cranial nerve deficit.     Sensory: No sensory deficit.     Coordination: Coordination normal.  Psychiatric:        Speech: Speech normal.        Behavior: Behavior normal.        Thought Content: Thought content normal.    ED Results / Procedures / Treatments   Labs (all labs ordered are listed, but only abnormal results are displayed) Labs Reviewed  CBC WITH DIFFERENTIAL/PLATELET - Abnormal; Notable for the following components:      Result Value   Platelets 143 (*)    All other components within normal limits  BASIC METABOLIC PANEL - Abnormal; Notable for the following components:   Potassium 3.3 (*)    All other components within normal limits  BRAIN NATRIURETIC PEPTIDE  D-DIMER, QUANTITATIVE  TROPONIN I (HIGH SENSITIVITY)    EKG EKG Interpretation  Date/Time:  Tuesday April 29 2021 04:19:49 EDT Ventricular Rate:  85 PR Interval:  139 QRS Duration: 91 QT Interval:  373 QTC Calculation: 444 R Axis:   -49 Text Interpretation: Sinus rhythm Left anterior fascicular block RSR' in V1 or V2, right VCD or RVH Confirmed by [id number] [phone]) on  04/29/2021 4:40:46 AM  Radiology DG Chest Port 1 View  Result Date: 04/29/2021 CLINICAL DATA:  Chest pain and shortness of breath EXAM: PORTABLE CHEST 1 VIEW COMPARISON:  12/13/2020 FINDINGS: Normal heart size  and mediastinal contours. No acute infiltrate or edema. No effusion or pneumothorax. No acute osseous findings. IMPRESSION: No evidence of active disease. Electronically Signed   By: Tiburcio Pea M.D.   On: 04/29/2021 05:41    Procedures Procedures   Medications Ordered in ED Medications - No data to display  ED Course  I have reviewed the triage vital signs and the nursing notes.  Pertinent labs & imaging results that were available during my care of the patient were reviewed by me and considered in my medical decision making (see chart for details).    MDM Rules/Calculators/A&P                           Patient presents to the emergency department for evaluation of chest pain.  Patient had onset of chest pain around midnight.  Patient states that he was falling asleep, recognized that he jolted awake and then started to feel like he was gasping for air.  This sounds like episodes of myoclonus.  He has had this happen 1 other time.  He reports that he was seen in the ED at that time and all the tests were normal.  Patient is EKG without changes from prior.  Troponin negative.  D-dimer negative.  Vital signs are entirely normal.  No tachypnea, tachycardia, hypoxia.  Wells criteria low risk with a negative D-dimer, no further work-up necessary for PE.  Symptoms felt to be very low likelihood for cardiac etiology as well.  Patient concerned about the numbness in his fingers, likely secondary to anxiety.  Symptoms are symmetric and bilateral, no concern for stroke.  Final Clinical Impression(s) / ED Diagnoses Final diagnoses:  Non-cardiac chest pain  Anxiety    Rx / DC Orders ED Discharge Orders     None        Gilda Crease, MD 04/29/21 [phone]
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
## 2021-04-29 NOTE — ED Notes (Signed)
This RN presented the AVS utilizing Teachback Method. Patient verbalizes understanding of Discharge Instructions. Opportunity for Questioning and Answers were provided. Patient Discharged from ED ambulatory to Home via SELF  

## 2021-06-01 ENCOUNTER — Emergency Department (HOSPITAL_BASED_OUTPATIENT_CLINIC_OR_DEPARTMENT_OTHER)
Admission: EM | Admit: 2021-06-01 | Discharge: 2021-06-01 | Disposition: A | Discharge status: Home / Self Care | Payer: Medicaid Other | Attending: Emergency Medicine | Admitting: Emergency Medicine

## 2021-06-01 ENCOUNTER — Emergency Department (HOSPITAL_BASED_OUTPATIENT_CLINIC_OR_DEPARTMENT_OTHER): Payer: Medicaid Other | Admitting: Radiology

## 2021-06-01 ENCOUNTER — Other Ambulatory Visit: Payer: Self-pay

## 2021-06-01 ENCOUNTER — Emergency Department (HOSPITAL_BASED_OUTPATIENT_CLINIC_OR_DEPARTMENT_OTHER): Payer: Medicaid Other

## 2021-06-01 ENCOUNTER — Encounter (HOSPITAL_BASED_OUTPATIENT_CLINIC_OR_DEPARTMENT_OTHER): Payer: Self-pay | Admitting: Obstetrics and Gynecology

## 2021-06-01 DIAGNOSIS — S5011XA Contusion of right forearm, initial encounter: Secondary | ICD-10-CM | POA: Insufficient documentation

## 2021-06-01 DIAGNOSIS — Z91040 Latex allergy status: Secondary | ICD-10-CM | POA: Insufficient documentation

## 2021-06-01 DIAGNOSIS — S80211A Abrasion, right knee, initial encounter: Secondary | ICD-10-CM | POA: Insufficient documentation

## 2021-06-01 DIAGNOSIS — Z87891 Personal history of nicotine dependence: Secondary | ICD-10-CM | POA: Insufficient documentation

## 2021-06-01 DIAGNOSIS — Y92009 Unspecified place in unspecified non-institutional (private) residence as the place of occurrence of the external cause: Secondary | ICD-10-CM | POA: Insufficient documentation

## 2021-06-01 DIAGNOSIS — S01512A Laceration without foreign body of oral cavity, initial encounter: Secondary | ICD-10-CM | POA: Insufficient documentation

## 2021-06-01 DIAGNOSIS — S299XXA Unspecified injury of thorax, initial encounter: Secondary | ICD-10-CM | POA: Insufficient documentation

## 2021-06-01 DIAGNOSIS — S0990XA Unspecified injury of head, initial encounter: Secondary | ICD-10-CM | POA: Insufficient documentation

## 2021-06-01 MED ORDER — ACETAMINOPHEN 325 MG PO TABS
650.0000 mg | ORAL_TABLET | Freq: Once | ORAL | Status: AC
  Administered 2021-06-01: 650 mg via ORAL
  Filled 2021-06-01: qty 2

## 2021-06-01 MED ORDER — CHLORHEXIDINE GLUCONATE 0.12% ORAL RINSE (MEDLINE KIT): 15.0000 mL | Freq: Two times a day (BID) | OROMUCOSAL | 0 refills | Status: AC

## 2021-06-01 MED ORDER — METHOCARBAMOL 500 MG PO TABS: 500.0000 mg | ORAL_TABLET | Freq: Two times a day (BID) | ORAL | 0 refills | Status: DC

## 2021-06-01 MED ORDER — NAPROXEN 500 MG PO TABS: 500.0000 mg | ORAL_TABLET | Freq: Two times a day (BID) | ORAL | 0 refills | Status: AC
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
## 2021-06-01 NOTE — Discharge Instructions (Addendum)
The wound in your mouth does not need to be repaired with sutures.  I prescribed a mouthwash for you called Peridex which he should use after eating to help prevent infection.  Additionally have prescribed naproxen and Robaxin to help with your symptoms of pain.  You may alternate taking Tylenol and Naproxen as needed for pain control. You may take Naproxen twice daily as directed on your discharge paperwork and you may take  [phone] mg of Tylenol every 6 hours. Do not exceed 4000 mg of Tylenol daily as this can lead to liver damage. Also, make sure to take Naproxen with meals as it can cause an upset stomach. Do not take other NSAIDs while taking Naproxen such as (Aleve, Ibuprofen, Aspirin, Celebrex, etc) and do not take more than the prescribed dose as this can lead to ulcers and bleeding in your GI tract. You may use warm and cold compresses to help with your symptoms.   You were given a prescription for Robaxin which is a muscle relaxer.  You should not drive, work, or operate machinery while taking this medication as it can make you very drowsy.     Please follow up with your primary doctor within the next 7-10 days for re-evaluation and further treatment of your symptoms.   Please return to the ER sooner if you have any new or worsening symptoms.
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
## 2022-10-12 ENCOUNTER — Emergency Department (HOSPITAL_BASED_OUTPATIENT_CLINIC_OR_DEPARTMENT_OTHER)
Admission: EM | Admit: 2022-10-12 | Discharge: 2022-10-12 | Disposition: A | Discharge status: Home / Self Care | Payer: Medicaid Other | Attending: Emergency Medicine | Admitting: Emergency Medicine

## 2022-10-12 ENCOUNTER — Other Ambulatory Visit: Payer: Self-pay

## 2022-10-12 ENCOUNTER — Encounter (HOSPITAL_BASED_OUTPATIENT_CLINIC_OR_DEPARTMENT_OTHER): Payer: Self-pay | Admitting: Emergency Medicine

## 2022-10-12 ENCOUNTER — Other Ambulatory Visit (HOSPITAL_BASED_OUTPATIENT_CLINIC_OR_DEPARTMENT_OTHER): Payer: Self-pay

## 2022-10-12 DIAGNOSIS — X58XXXA Exposure to other specified factors, initial encounter: Secondary | ICD-10-CM | POA: Insufficient documentation

## 2022-10-12 DIAGNOSIS — S0502XA Injury of conjunctiva and corneal abrasion without foreign body, left eye, initial encounter: Secondary | ICD-10-CM | POA: Insufficient documentation

## 2022-10-12 DIAGNOSIS — Z91040 Latex allergy status: Secondary | ICD-10-CM | POA: Insufficient documentation

## 2022-10-12 DIAGNOSIS — S0592XA Unspecified injury of left eye and orbit, initial encounter: Secondary | ICD-10-CM | POA: Diagnosis present

## 2022-10-12 MED ORDER — FLUORESCEIN SODIUM 1 MG OP STRP
1.0000 | ORAL_STRIP | Freq: Once | OPHTHALMIC | Status: AC
  Administered 2022-10-12: 1 via OPHTHALMIC
  Filled 2022-10-12: qty 1

## 2022-10-12 MED ORDER — TETRACAINE HCL 0.5 % OP SOLN
2.0000 [drp] | Freq: Once | OPHTHALMIC | Status: AC
  Administered 2022-10-12: 2 [drp] via OPHTHALMIC
  Filled 2022-10-12: qty 4

## 2022-10-12 MED ORDER — FLUORESCEIN SODIUM 1 MG OP STRP
ORAL_STRIP | OPHTHALMIC | Status: AC
  Filled 2022-10-12: qty 1

## 2022-10-12 MED ORDER — ZYRTEC ALLERGY 10 MG PO CAPS: 10.0000 mg | ORAL_CAPSULE | Freq: Every day | ORAL | 0 refills | Status: AC

## 2022-10-12 MED ORDER — CIPROFLOXACIN HCL 0.3 % OP SOLN: 2.0000 [drp] | OPHTHALMIC | 0 refills | Status: AC

## 2022-10-12 NOTE — Discharge Instructions (Addendum)
Note the visit today was overall consistent with foreign body in your left eye as well as scratch associated.  Will change antibiotic drops to ciprofloxacin use every 4 hours while awake.  Also recommend adding allergy medicine in the form of Zyrtec to take daily while symptomatic.  You may continue to use rewetting drops that you bought over-the-counter.  Please do not hesitate to return to emergency department if the worrisome signs and symptoms we discussed become apparent.

## 2022-10-12 NOTE — ED Notes (Signed)
Left eye irrigated with normal saline pt states feels better

## 2022-10-12 NOTE — ED Triage Notes (Signed)
Pt arrives to ED with c/o feeling like something is in his left eye since yesterday.

## 2022-10-12 NOTE — ED Provider Notes (Signed)
[facility] Provider Note   CSN: [id number] Arrival date & time: 10/12/22  1221     History  Chief Complaint  Patient presents with   Eye Problem    Billy [name] is a 42 y.o. male.   Eye Problem   42 year old male presents emergency department with complaints of foreign body sensation in his left eye.  Patient states that symptoms began upon awakening this past Sunday.  Was sent in polymyxin drops in his left eye that he began yesterday afternoon around 5 PM of which she states has not helped his symptoms.  Has not tried eye irrigation.  States that he sleeps with a down blanket/comforter and states that he may have had feather or dust in his left eye.  Denies contact lens or glasses use.  Denies any visual disturbance, pain with eye movements, redness or swelling of his left eye.  Past medical history significant for allergies, anxiety, migraine  Home Medications Prior to Admission medications   Medication Sig Start Date End Date Taking? Authorizing Provider  Cetirizine HCl (ZYRTEC ALLERGY) 10 MG CAPS Take 1 capsule (10 mg total) by mouth daily. 10/12/22  Yes Dion Saucier A, PA  ciprofloxacin (CILOXAN) 0.3 % ophthalmic solution Place 2 drops into the right eye every 4 (four) hours while awake. Administer 1 drop, every 2 hours, while awake, for 2 days. Then 1 drop, every 4 hours, while awake, for the next 5 days. 10/12/22  Yes Dion Saucier A, PA  ALPRAZolam Duanne Moron) 1 MG tablet Take 1 mg by mouth daily as needed for anxiety. 05/10/19   [provider]  chlorhexidine gluconate, MEDLINE KIT, (PERIDEX) 0.12 % solution Use as directed 15 mLs in the mouth or throat 2 (two) times daily. 06/01/21   Couture, Cortni S, PA-C  fluticasone (FLONASE) 50 MCG/ACT nasal spray Place 1 spray into both nostrils daily. 12/14/20   Petrucelli, Samantha R, PA-C  methocarbamol (ROBAXIN) 500 MG tablet Take 1 tablet (500 mg total) by mouth 2 (two) times  daily. 06/01/21   Couture, Cortni S, PA-C  nortriptyline (PAMELOR) 50 MG capsule Take 2 capsules (100 mg total) by mouth at bedtime. 12/02/16   Marcial Pacas, MD  PRESCRIPTION MEDICATION Take 1 tablet by mouth at bedtime as needed (sleep).    [provider]      Allergies    Dust mite extract, Other, Latex, and Molds & smuts    Review of Systems   Review of Systems  All other systems reviewed and are negative.   Physical Exam Updated Vital Signs BP (!) 131/96 (BP Location: Left Arm)   Pulse 90   Temp 98.5 F (36.9 C)   Resp 14   Ht '5\' 7"'$  (1.702 m)   Wt 81.6 kg   SpO2 98%   BMI 28.19 kg/m  Physical Exam Vitals and nursing note reviewed.  Constitutional:      General: He is not in acute distress.    Appearance: He is well-developed.  HENT:     Head: Normocephalic and atraumatic.  Eyes:     Extraocular Movements:     Right eye: Normal extraocular motion and no nystagmus.     Left eye: Normal extraocular motion and no nystagmus.     Conjunctiva/sclera:     Left eye: Left conjunctiva is injected. No exudate or hemorrhage.     Comments: No external abnormalities appreciated including swelling, erythema, induration, palpable fluctuance of upper or lower eyelid on the left.  Left eye injected with no obvious hemorrhage of conjunctival.  Foreign body appreciated in left lower eyelid which was removed via irrigation during eye exam.  Fluorescein staining was obtained which showed small linear corneal abrasion as indicated above.  Seidel sign negative.  No obvious ulceration.  No obvious rust ring.  No additional foreign bodies appreciated.  Eye to be irrigated by nursing staff as well as visual acuity to be obtained.  Visual acuity performed by nursing staff 20/25 OD OS OU.  Cardiovascular:     Rate and Rhythm: Normal rate and regular rhythm.     Heart sounds: No murmur heard. Pulmonary:     Effort: Pulmonary effort is normal. No respiratory distress.     Breath sounds:  Normal breath sounds.  Abdominal:     Palpations: Abdomen is soft.     Tenderness: There is no abdominal tenderness.  Musculoskeletal:        General: No swelling.     Cervical back: Neck supple.  Skin:    General: Skin is warm and dry.     Capillary Refill: Capillary refill takes less than 2 seconds.  Neurological:     Mental Status: He is alert.  Psychiatric:        Mood and Affect: Mood normal.     ED Results / Procedures / Treatments   Labs (all labs ordered are listed, but only abnormal results are displayed) Labs Reviewed - No data to display  EKG None  Radiology No results found.  Procedures Irrigation  Date/Time: 10/12/2022 4:02 PM  Performed by: Wilnette Kales, PA Authorized by: Wilnette Kales, PA  Consent: Verbal consent obtained. Risks and benefits: risks, benefits and alternatives were discussed Consent given by: patient Patient understanding: patient states understanding of the procedure being performed Patient consent: the patient's understanding of the procedure matches consent given Procedure consent: procedure consent matches procedure scheduled Patient identity confirmed: verbally with patient Time out: Immediately prior to procedure a "time out" was called to verify the correct patient, procedure, equipment, support staff and site/side marked as required. Preparation: Patient was prepped and draped in the usual sterile fashion. Local anesthesia used: no  Anesthesia: Local anesthesia used: no  Sedation: Patient sedated: no  Patient tolerance: patient tolerated the procedure well with no immediate complications Comments: Left eye irrigation       Medications Ordered in ED Medications  tetracaine (PONTOCAINE) 0.5 % ophthalmic solution 2 drop (has no administration in time range)  fluorescein ophthalmic strip 1 strip (has no administration in time range)    ED Course/ Medical Decision Making/ A&P                              Medical Decision Making Risk OTC drugs. Prescription drug management.   This patient presents to the ED for concern of foreign body in eye, this involves an extensive number of treatment options, and is a complaint that carries with it a high risk of complications and morbidity.  The differential diagnosis includes foreign body retention, corneal abrasion, corneal ulceration, global perforation   Co morbidities that complicate the patient evaluation  See HPI   Additional history obtained:  Additional history obtained from EMR External records from outside source obtained and reviewed including hospital records   Lab Tests:  N/a   Imaging Studies ordered:  N/a   Cardiac Monitoring: / EKG:  The patient was maintained on a cardiac monitor.  I  personally viewed and interpreted the cardiac monitored which showed an underlying rhythm of: Sinus rhythm   Consultations Obtained:  N/a   Problem List / ED Course / Critical interventions / Medication management  Corneal abrasion I ordered medication including pain fluorescein strips for eye exam   Reevaluation of the patient after these medicines showed that the patient improved I have reviewed the patients home medicines and have made adjustments as needed   Social Determinants of Health:  Former cigarette use.  Denies illicit drug use.   Test / Admission - Considered:  Corneal abrasion Vitals signs  within normal range and stable throughout visit. Patient with evidence of left-sided corneal abrasion.  No evidence of global perforation, corneal ulceration.  Patient's eye was irrigated multiple times on the emergency department of which he felt relief.  Patient will prescribe topical antibiotics given current corneal abrasion.  Visual acuity within normal limits.  Recommend follow-up with eye specialist within the next 2 to 3 days for reassessment of symptoms.  Treatment plan discussed at length with patient and he  acknowledged understanding was agreeable to said plan. Worrisome signs and symptoms were discussed with the patient, and the patient acknowledged understanding to return to the ED if noticed. Patient was stable upon discharge.          Final Clinical Impression(s) / ED Diagnoses Final diagnoses:  Abrasion of left cornea, initial encounter    Rx / DC Orders ED Discharge Orders          Ordered    ciprofloxacin (CILOXAN) 0.3 % ophthalmic solution  Every 4 hours while awake        10/12/22 1314    Cetirizine HCl (ZYRTEC ALLERGY) 10 MG CAPS  Daily        10/12/22 1314              Wilnette Kales, [place] 10/12/22 [place], Adam, DO 10/14/22 2157

## 2022-10-12 NOTE — ED Notes (Signed)
Visual Acuity: L eye: 20/25, R eye 20/25

## 2022-12-16 ENCOUNTER — Encounter (HOSPITAL_BASED_OUTPATIENT_CLINIC_OR_DEPARTMENT_OTHER): Payer: Self-pay

## 2022-12-16 ENCOUNTER — Other Ambulatory Visit: Payer: Self-pay

## 2022-12-16 ENCOUNTER — Emergency Department (HOSPITAL_BASED_OUTPATIENT_CLINIC_OR_DEPARTMENT_OTHER): Payer: Medicaid Other | Admitting: Radiology

## 2022-12-16 DIAGNOSIS — N179 Acute kidney failure, unspecified: Secondary | ICD-10-CM | POA: Diagnosis not present

## 2022-12-16 DIAGNOSIS — I5A Non-ischemic myocardial injury (non-traumatic): Secondary | ICD-10-CM | POA: Insufficient documentation

## 2022-12-16 DIAGNOSIS — R002 Palpitations: Secondary | ICD-10-CM | POA: Diagnosis not present

## 2022-12-16 DIAGNOSIS — R072 Precordial pain: Secondary | ICD-10-CM | POA: Diagnosis not present

## 2022-12-16 DIAGNOSIS — E785 Hyperlipidemia, unspecified: Secondary | ICD-10-CM | POA: Insufficient documentation

## 2022-12-16 DIAGNOSIS — R778 Other specified abnormalities of plasma proteins: Secondary | ICD-10-CM | POA: Diagnosis not present

## 2022-12-16 DIAGNOSIS — R7989 Other specified abnormal findings of blood chemistry: Secondary | ICD-10-CM | POA: Insufficient documentation

## 2022-12-16 DIAGNOSIS — Z79899 Other long term (current) drug therapy: Secondary | ICD-10-CM | POA: Insufficient documentation

## 2022-12-16 DIAGNOSIS — R0602 Shortness of breath: Secondary | ICD-10-CM | POA: Diagnosis not present

## 2022-12-16 DIAGNOSIS — Z91040 Latex allergy status: Secondary | ICD-10-CM | POA: Insufficient documentation

## 2022-12-16 DIAGNOSIS — Z87891 Personal history of nicotine dependence: Secondary | ICD-10-CM | POA: Diagnosis not present

## 2022-12-16 DIAGNOSIS — R0789 Other chest pain: Secondary | ICD-10-CM | POA: Diagnosis not present

## 2022-12-16 DIAGNOSIS — Z7952 Long term (current) use of systemic steroids: Secondary | ICD-10-CM | POA: Diagnosis not present

## 2022-12-16 LAB — CBC WITH DIFFERENTIAL/PLATELET
Abs Immature Granulocytes: 0.04 10*3/uL (ref 0.00–0.07)
Basophils Absolute: 0 10*3/uL (ref 0.0–0.1)
Basophils Relative: 0 %
Eosinophils Absolute: 0.2 10*3/uL (ref 0.0–0.5)
Eosinophils Relative: 1 %
HCT: 42.6 % (ref 39.0–52.0)
Hemoglobin: 15.2 g/dL (ref 13.0–17.0)
Immature Granulocytes: 0 %
Lymphocytes Relative: 12 %
Lymphs Abs: 1.5 10*3/uL (ref 0.7–4.0)
MCH: 31.9 pg (ref 26.0–34.0)
MCHC: 35.7 g/dL (ref 30.0–36.0)
MCV: 89.5 fL (ref 80.0–100.0)
Monocytes Absolute: 0.6 10*3/uL (ref 0.1–1.0)
Monocytes Relative: 5 %
Neutro Abs: 9.5 10*3/uL — ABNORMAL HIGH (ref 1.7–7.7)
Neutrophils Relative %: 82 %
Platelets: 171 10*3/uL (ref 150–400)
RBC: 4.76 MIL/uL (ref 4.22–5.81)
RDW: 12.5 % (ref 11.5–15.5)
WBC: 11.8 10*3/uL — ABNORMAL HIGH (ref 4.0–10.5)
nRBC: 0 % (ref 0.0–0.2)

## 2022-12-16 LAB — COMPREHENSIVE METABOLIC PANEL
ALT: 24 U/L (ref 0–44)
AST: 23 U/L (ref 15–41)
Albumin: 4.6 g/dL (ref 3.5–5.0)
Alkaline Phosphatase: 73 U/L (ref 38–126)
Anion gap: 8 (ref 5–15)
BUN: 11 mg/dL (ref 6–20)
CO2: 28 mmol/L (ref 22–32)
Calcium: 9.7 mg/dL (ref 8.9–10.3)
Chloride: 103 mmol/L (ref 98–111)
Creatinine, Ser: 1.4 mg/dL — ABNORMAL HIGH (ref 0.61–1.24)
GFR, Estimated: >60 mL/min (ref >60)
Glucose, Bld: 98 mg/dL (ref 70–99)
Potassium: 3.5 mmol/L (ref 3.5–5.1)
Sodium: 139 mmol/L (ref 135–145)
Total Bilirubin: 1.1 mg/dL (ref 0.3–1.2)
Total Protein: 7.9 g/dL (ref 6.5–8.1)

## 2022-12-16 LAB — TROPONIN I (HIGH SENSITIVITY): Troponin I (High Sensitivity): 24 ng/L — ABNORMAL HIGH (ref <18)

## 2022-12-16 MED ORDER — ALBUTEROL SULFATE HFA 108 (90 BASE) MCG/ACT IN AERS: 2.0000 | INHALATION_SPRAY | RESPIRATORY_TRACT | Status: DC | PRN

## 2022-12-16 NOTE — ED Triage Notes (Signed)
Pt c/o centralized "pressure/ tightness-type" CP/SHOB onset 1hr ago. States he worked out, showered, fatigue/SHOB onset in the shower. Denies known resp/ cardiac hx.

## 2022-12-17 ENCOUNTER — Observation Stay (HOSPITAL_BASED_OUTPATIENT_CLINIC_OR_DEPARTMENT_OTHER)
Admission: EM | Admit: 2022-12-17 | Discharge: 2022-12-18 | Disposition: A | Discharge status: Home / Self Care | Payer: Medicaid Other | Attending: Family Medicine | Admitting: Family Medicine

## 2022-12-17 ENCOUNTER — Other Ambulatory Visit: Payer: Self-pay | Admitting: Cardiology

## 2022-12-17 ENCOUNTER — Observation Stay (HOSPITAL_BASED_OUTPATIENT_CLINIC_OR_DEPARTMENT_OTHER): Payer: Medicaid Other

## 2022-12-17 DIAGNOSIS — R002 Palpitations: Secondary | ICD-10-CM | POA: Diagnosis not present

## 2022-12-17 DIAGNOSIS — R072 Precordial pain: Secondary | ICD-10-CM | POA: Diagnosis not present

## 2022-12-17 DIAGNOSIS — Z91040 Latex allergy status: Secondary | ICD-10-CM | POA: Diagnosis not present

## 2022-12-17 DIAGNOSIS — N179 Acute kidney failure, unspecified: Secondary | ICD-10-CM | POA: Diagnosis not present

## 2022-12-17 DIAGNOSIS — R079 Chest pain, unspecified: Secondary | ICD-10-CM | POA: Diagnosis not present

## 2022-12-17 DIAGNOSIS — R7989 Other specified abnormal findings of blood chemistry: Secondary | ICD-10-CM

## 2022-12-17 DIAGNOSIS — Z79899 Other long term (current) drug therapy: Secondary | ICD-10-CM | POA: Diagnosis not present

## 2022-12-17 DIAGNOSIS — F419 Anxiety disorder, unspecified: Secondary | ICD-10-CM

## 2022-12-17 DIAGNOSIS — Z87891 Personal history of nicotine dependence: Secondary | ICD-10-CM | POA: Diagnosis not present

## 2022-12-17 DIAGNOSIS — E785 Hyperlipidemia, unspecified: Secondary | ICD-10-CM | POA: Diagnosis not present

## 2022-12-17 DIAGNOSIS — R0602 Shortness of breath: Secondary | ICD-10-CM | POA: Diagnosis present

## 2022-12-17 DIAGNOSIS — I5A Non-ischemic myocardial injury (non-traumatic): Secondary | ICD-10-CM | POA: Diagnosis not present

## 2022-12-17 DIAGNOSIS — Z7952 Long term (current) use of systemic steroids: Secondary | ICD-10-CM | POA: Diagnosis not present

## 2022-12-17 LAB — RAPID URINE DRUG SCREEN, HOSP PERFORMED
Amphetamines: NOT DETECTED
Barbiturates: NOT DETECTED
Benzodiazepines: NOT DETECTED
Cocaine: NOT DETECTED
Opiates: NOT DETECTED
Tetrahydrocannabinol: NOT DETECTED

## 2022-12-17 LAB — LIPID PANEL
Cholesterol: 192 mg/dL (ref 0–200)
HDL: 52 mg/dL (ref >40)
LDL Cholesterol: 113 mg/dL — ABNORMAL HIGH (ref 0–99)
Total CHOL/HDL Ratio: 3.7 RATIO
Triglycerides: 135 mg/dL (ref <150)
VLDL: 27 mg/dL (ref 0–40)

## 2022-12-17 LAB — BASIC METABOLIC PANEL
Anion gap: 8 (ref 5–15)
BUN: 13 mg/dL (ref 6–20)
CO2: 24 mmol/L (ref 22–32)
Calcium: 9 mg/dL (ref 8.9–10.3)
Chloride: 106 mmol/L (ref 98–111)
Creatinine, Ser: 1.14 mg/dL (ref 0.61–1.24)
GFR, Estimated: >60 mL/min (ref >60)
Glucose, Bld: 94 mg/dL (ref 70–99)
Potassium: 3.3 mmol/L — ABNORMAL LOW (ref 3.5–5.1)
Sodium: 138 mmol/L (ref 135–145)

## 2022-12-17 LAB — D-DIMER, QUANTITATIVE: D-Dimer, Quant: <0.27 ug/mL-FEU (ref 0.00–0.50)

## 2022-12-17 LAB — ECHOCARDIOGRAM COMPLETE
Area-P 1/2: 2.88 cm2
S' Lateral: 2.5 cm

## 2022-12-17 LAB — MAGNESIUM: Magnesium: 2 mg/dL (ref 1.7–2.4)

## 2022-12-17 LAB — TSH: TSH: 0.823 u[IU]/mL (ref 0.350–4.500)

## 2022-12-17 LAB — TROPONIN I (HIGH SENSITIVITY)
Troponin I (High Sensitivity): 34 ng/L — ABNORMAL HIGH (ref <18)
Troponin I (High Sensitivity): 20 ng/L — ABNORMAL HIGH (ref <18)

## 2022-12-17 LAB — HIV ANTIBODY (ROUTINE TESTING W REFLEX): HIV Screen 4th Generation wRfx: NONREACTIVE

## 2022-12-17 MED ORDER — ASPIRIN 81 MG PO CHEW
324.0000 mg | CHEWABLE_TABLET | Freq: Once | ORAL | Status: AC
  Administered 2022-12-17: 324 mg via ORAL
  Filled 2022-12-17: qty 4

## 2022-12-17 MED ORDER — SODIUM CHLORIDE 0.9 % IV SOLN: INTRAVENOUS | Status: DC

## 2022-12-17 MED ORDER — ALBUTEROL SULFATE (2.5 MG/3ML) 0.083% IN NEBU: 2.5000 mg | INHALATION_SOLUTION | RESPIRATORY_TRACT | Status: DC | PRN

## 2022-12-17 MED ORDER — SODIUM CHLORIDE 0.9 % IV SOLN: INTRAVENOUS | Status: AC

## 2022-12-17 MED ORDER — FLUTICASONE PROPIONATE 50 MCG/ACT NA SUSP
1.0000 | Freq: Every day | NASAL | Status: DC
  Administered 2022-12-17: 1 via NASAL
  Filled 2022-12-17: qty 16

## 2022-12-17 MED ORDER — ENOXAPARIN SODIUM 40 MG/0.4ML IJ SOSY
40.0000 mg | PREFILLED_SYRINGE | Freq: Every day | INTRAMUSCULAR | Status: DC
  Administered 2022-12-17: 40 mg via SUBCUTANEOUS
  Filled 2022-12-17: qty 0.4

## 2022-12-17 MED ORDER — ALUM & MAG HYDROXIDE-SIMETH 200-200-20 MG/5ML PO SUSP: 30.0000 mL | Freq: Four times a day (QID) | ORAL | Status: DC | PRN

## 2022-12-17 MED ORDER — ONDANSETRON HCL 4 MG/2ML IJ SOLN: 4.0000 mg | Freq: Four times a day (QID) | INTRAMUSCULAR | Status: DC | PRN

## 2022-12-17 MED ORDER — ACETAMINOPHEN 325 MG PO TABS: 650.0000 mg | ORAL_TABLET | ORAL | Status: DC | PRN

## 2022-12-17 MED ORDER — NITROGLYCERIN 0.4 MG SL SUBL
0.4000 mg | SUBLINGUAL_TABLET | SUBLINGUAL | Status: DC | PRN
  Administered 2022-12-17: 0.4 mg via SUBLINGUAL
  Filled 2022-12-17: qty 1

## 2022-12-17 NOTE — ED Notes (Signed)
Pt brought back from lobby to room. Was ambulatory without assistance. Pt reports ongoing centralized chest pain since arrival along with SOB. Resp regular and unlabored. Pt connected to vs monitor system. Delta trop obtained and sent to lab. Pt provided with warm blanket. Denies any additional needs.

## 2022-12-17 NOTE — Consult Note (Addendum)
Cardiology Consultation   Patient ID: Billy [name] MRN: [id number]; DOB: 1981/04/20  Admit date: 12/17/2022 Date of Consult: 12/17/2022  PCP:  Patient, No Pcp Per   [facility] Providers Cardiologist:  None        Patient Profile:   Billy [name] is a 42 y.o. male with a hx of anxiety, possible heart murmur, who is being seen 12/17/2022 for the evaluation of chest pain at the request of Dr. Katrinka Blazing.  History of Present Illness:   Billy [name] initially presented to the [facility] ED on the night of 5/1 after he developed shortness of breath and  chest pain following a cardio workout earlier in the evening.  Patient says that on Tuesday evening he watched an [employer] with a group of friends and consumed approximately 6 high ABV beverages.  The next morning (Wednesday), patient says that he filled up his 1 gallon water jug to consume throughout the day but otherwise did not have any food to eat because he has been intermittently fasting to try to lose weight.  Around 7:00 on Wednesday evening, patient began to do a relatively cardio intense workout.  He says that he felt like he was more fatigued than usual and decided that he did finally need to eat some food.  Patient ate and then resume to the workout.  He denies having chest pain, chest tightness, shortness of breath while working out.  Following his workout while walking up the stairs to shower, patient says that he developed shortness of breath with chest tightness and sensation of rapid heart rate.  This persisted through his shower and prompted him to present to the emergency department at [facility] for further evaluation.  At [facility], patient received nitroglycerin and he says that this did help to improve his symptoms.  Patient notes a similar episode of chest tightness and rapid heart rate in the setting of drinking a couple of years ago.  However symptoms are not always associated with drinking and he reports  that he has frequent but brief episodes of palpitations, approximately once per day.  He does not associate any particular trigger of these episodes.  Patient denies limitation to daily physical activity.  At [facility], EKG without acute ischemic changes. Troponin checked, found very mildly elevated with minimal delta: 24->34->20. CBC with mild leukocytosis, 11.8. BMP showed creatinine elevated to 1.4 (never previously high).    Past Medical History:  Diagnosis Date   Allergy    Anxiety    Facial fracture (HCC)    Headache    Heart murmur    Shoulder pain, right     Past Surgical History:  Procedure Laterality Date   WISDOM TOOTH EXTRACTION       Home Medications:  Prior to Admission medications   Medication Sig Start Date End Date Taking? Authorizing Provider  ALPRAZolam Prudy Feeler) 1 MG tablet Take 1 mg by mouth daily as needed for anxiety. 05/10/19   [provider]  Cetirizine HCl (ZYRTEC ALLERGY) 10 MG CAPS Take 1 capsule (10 mg total) by mouth daily. 10/12/22   Peter Garter, PA  chlorhexidine gluconate, MEDLINE KIT, (PERIDEX) 0.12 % solution Use as directed 15 mLs in the mouth or throat 2 (two) times daily. 06/01/21   Couture, Cortni S, PA-C  ciprofloxacin (CILOXAN) 0.3 % ophthalmic solution Place 2 drops into the right eye every 4 (four) hours while awake. Administer 1 drop, every 2 hours, while awake, for 2 days. Then 1 drop, every  4 hours, while awake, for the next 5 days. 10/12/22   Peter Garter, PA  fluticasone (FLONASE) 50 MCG/ACT nasal spray Place 1 spray into both nostrils daily. 12/14/20   Petrucelli, Samantha R, PA-C  methocarbamol (ROBAXIN) 500 MG tablet Take 1 tablet (500 mg total) by mouth 2 (two) times daily. 06/01/21   Couture, Cortni S, PA-C  nortriptyline (PAMELOR) 50 MG capsule Take 2 capsules (100 mg total) by mouth at bedtime. 12/02/16   Levert Feinstein, MD  PRESCRIPTION MEDICATION Take 1 tablet by mouth at bedtime as needed (sleep).    [provider]    Inpatient Medications: Scheduled Meds:  enoxaparin (LOVENOX) injection  40 mg Subcutaneous Daily   Continuous Infusions:  sodium chloride     PRN Meds: acetaminophen, albuterol, alum & mag hydroxide-simeth, nitroGLYCERIN, ondansetron (ZOFRAN) IV  Allergies:    Allergies  Allergen Reactions   Dust Mite Extract Itching and Other (See Comments)    Sneezing, dry eyes   Other Other (See Comments)   Latex Other (See Comments)    Other   Molds & Smuts Itching and Other (See Comments)    Sneezing,dry eyes Sneezing,dry eyes    Social History:   Social History   Socioeconomic History   Marital status: Single    Spouse name: Not on file   Number of children: 1   Years of education: Bachelors   Highest education level: Not on file  Occupational History   Occupation: Public relations account executive  Tobacco Use   Smoking status: Former    Packs/day: 0.50    Years: 0.50    Additional pack years: 0.00    Total pack years: 0.25    Types: Cigarettes    Passive exposure: Past   Smokeless tobacco: Never  Substance and Sexual Activity   Alcohol use: Yes    Alcohol/week: 3.0 - 4.0 standard drinks of alcohol    Types: 3 - 4 Standard drinks or equivalent per week   Drug use: Not Currently    Types: Marijuana   Sexual activity: Yes    Partners: Female  Other Topics Concern   Not on file  Social History Narrative   Lives at home alone.   Right-handed.   Occasional caffeine use, 0-2 a week    Social Determinants of Corporate investment banker Strain: Not on file  Food Insecurity: Not on file  Transportation Needs: Not on file  Physical Activity: Not on file  Stress: Not on file  Social Connections: Not on file  Intimate Partner Violence: Not on file    Family History:    Family History  Problem Relation Age of Onset   Hypertension Mother    Diabetes Mother    Diabetes Maternal Grandmother    Heart disease Maternal Grandmother    Stroke Maternal Grandmother     Hyperlipidemia Maternal Grandmother    Hypertension Maternal Grandmother    Stroke Maternal Grandfather    Heart disease Maternal Grandfather    Diabetes Maternal Grandfather    Hyperlipidemia Maternal Grandfather    Diabetes Paternal Grandmother    Hyperlipidemia Paternal Grandmother    Hypertension Paternal Grandmother    Hypertension Paternal Grandfather    Cancer Paternal Grandfather    Healthy Father    Kidney disease Maternal Aunt    Heart disease Maternal Uncle      ROS:  Please see the history of present illness.   All other ROS reviewed and negative.     Physical Exam/Data:   Vitals:  12/17/22 0645 12/17/22 0700 12/17/22 1000 12/17/22 1130  BP:   (!) 125/100 133/89  Pulse: (!) 49 (!) 55 61 60  Resp: 14 15 16 16   Temp:   98.2 F (36.8 C) 97.9 F (36.6 C)  TempSrc:    Oral  SpO2: 97% 97% 98%    No intake or output data in the 24 hours ending 12/17/22 1625    10/12/2022   12:33 PM 06/01/2021    2:05 PM 04/29/2021    4:21 AM  Last 3 Weights  Weight (lbs) 180 lb 180 lb 180 lb  Weight (kg) 81.647 kg 81.647 kg 81.647 kg     There is no height or weight on file to calculate BMI.  General:  Well nourished, well developed, in no acute distress HEENT: normal Neck: no JVD Vascular: No carotid bruits; Distal pulses 2+ bilaterally Cardiac:  normal S1, S2; RRR; no murmur  Lungs:  clear to auscultation bilaterally, no wheezing, rhonchi or rales  Abd: soft, nontender, no hepatomegaly  Ext: no edema Musculoskeletal:  No deformities, BUE and BLE strength normal and equal Skin: warm and dry  Neuro:  CNs 2-12 intact, no focal abnormalities noted Psych:  Normal affect   EKG:  The EKG was personally reviewed and demonstrates: Sinus rhythm without acute ischemic changes. Telemetry:  Telemetry was personally reviewed and demonstrates: Sinus rhythm, rates vary from bradycardia overnight to steady 60s to 70s during the day  Relevant CV Studies:  12/17/22 TTE  IMPRESSIONS      1. Left ventricular ejection fraction, by estimation, is 65 to 70%. The  left ventricle has normal function. The left ventricle has no regional  wall motion abnormalities. There is mild left ventricular hypertrophy.  Left ventricular diastolic parameters  were normal. The average left ventricular global longitudinal strain is  -21.0 %. The global longitudinal strain is normal.   2. Right ventricular systolic function is normal. The right ventricular  size is normal.   3. The mitral valve is grossly normal. Trivial mitral valve  regurgitation.   4. The aortic valve is tricuspid. Aortic valve regurgitation is not  visualized.   5. The inferior vena cava is normal in size with greater than 50%  respiratory variability, suggesting right atrial pressure of 3 mmHg.   Comparison(s): No prior Echocardiogram.   FINDINGS   Left Ventricle: Left ventricular ejection fraction, by estimation, is 65  to 70%. The left ventricle has normal function. The left ventricle has no  regional wall motion abnormalities. The average left ventricular global  longitudinal strain is -21.0 %.  The global longitudinal strain is normal. The left ventricular internal  cavity size was normal in size. There is mild left ventricular  hypertrophy. Left ventricular diastolic parameters were normal.   Right Ventricle: The right ventricular size is normal. No increase in  right ventricular wall thickness. Right ventricular systolic function is  normal.   Left Atrium: Left atrial size was normal in size.   Right Atrium: Right atrial size was normal in size.   Pericardium: There is no evidence of pericardial effusion.   Mitral Valve: The mitral valve is grossly normal. Trivial mitral valve  regurgitation.   Tricuspid Valve: The tricuspid valve is grossly normal. Tricuspid valve  regurgitation is trivial.   Aortic Valve: The aortic valve is tricuspid. Aortic valve regurgitation is  not visualized.   Pulmonic  Valve: The pulmonic valve was grossly normal. Pulmonic valve  regurgitation is trivial.   Aorta: The aortic root and  ascending aorta are structurally normal, with  no evidence of dilitation.   Venous: The inferior vena cava is normal in size with greater than 50%  respiratory variability, suggesting right atrial pressure of 3 mmHg.   IAS/Shunts: No atrial level shunt detected by color flow Doppler.   Laboratory Data:  High Sensitivity Troponin:   Recent Labs  Lab 12/16/22 2215 12/17/22 0012 12/17/22 0608  TROPONINIHS 24* 34* 20*     Chemistry Recent Labs  Lab 12/16/22 2215  NA 139  K 3.5  CL 103  CO2 28  GLUCOSE 98  BUN 11  CREATININE 1.40*  CALCIUM 9.7  GFRNONAA >60  ANIONGAP 8    Recent Labs  Lab 12/16/22 2215  PROT 7.9  ALBUMIN 4.6  AST 23  ALT 24  ALKPHOS 73  BILITOT 1.1   Lipids  Recent Labs  Lab 12/17/22 1245  CHOL 192  TRIG 135  HDL 52  LDLCALC 113*  CHOLHDL 3.7    Hematology Recent Labs  Lab 12/16/22 2215  WBC 11.8*  RBC 4.76  HGB 15.2  HCT 42.6  MCV 89.5  MCH 31.9  MCHC 35.7  RDW 12.5  PLT 171   Thyroid No results for input(s): "TSH", "FREET4" in the last 168 hours.  BNPNo results for input(s): "BNP", "PROBNP" in the last 168 hours.  DDimer No results for input(s): "DDIMER" in the last 168 hours.   Radiology/Studies:  DG Chest 2 View  Result Date: 12/16/2022 CLINICAL DATA:  Shortness of breath. EXAM: CHEST - 2 VIEW COMPARISON:  Chest radiograph dated 05/01/2022. FINDINGS: The heart size and mediastinal contours are within normal limits. Both lungs are clear. The visualized skeletal structures are unremarkable. IMPRESSION: No active cardiopulmonary disease. Electronically Signed   By: Elgie Collard M.D.   On: 12/16/2022 22:25     Assessment and Plan:    Chest tightness Dyspnea Palpitations  Cardiology asked to consult on this patient due to minimally elevated troponin, chest tightness, shortness of breath,  palpitations.  Suspect that patient's chest tightness and shortness of breath was likely secondary to rapid heart rate provoked by a cardiovascularly intense workout after heavy drinking the prior evening and very minimal oral intake during the day.  Creatinine elevation supports dehydration.  TTE very reassuring with normal LVEF and no WMA or significant valvular abnormalities. Minimally elevated troponin 24->34->20 likely secondary to rapid heart rate, dehydration, intense cardiovascular workout.  Given reported frequency of palpitations, could consider placing a heart monitor. Will discuss need for outpatient follow up with Dr. Antoine Poche.   Risk Assessment/Risk Scores:       For questions or updates, please contact [facility] Please consult [email or web address] for contact info under    Signed, Perlie Gold, PA-C  12/17/2022 4:25 PM  History and all data above reviewed.  Patient examined.  I agree with the findings as above.   The patient presents with symptoms as above.  He had some discomfort in his chest Wednesday night.  This was after his usual exercising intently, maybe drinking a little more alcohol over few days intermittently and possibly being dehydrated.  He exercises routinely.  He does not usually get any symptoms with exercise.  He might get occasional discomfort or sensation of maybe his heart racing when he is stressed at work.  He has not had any prior cardiac history other than possible murmur in the past.  He did have a minimally elevated nondiagnostic troponin as above.  Echo was unremarkable and EKG demonstrated  no acute abnormalities.  The patient exam reveals COR: Regular rate and rhythm,,  Lungs: No wheezing, no crackles,  Abd: Positive bowel sounds normal frequency pitch, bruits, rebound, guarding, Ext 2+ pulses, no edema..  All available labs, radiology testing, previous records reviewed. Agree with documented assessment and plan.  Chest discomfort:  This has non  anginal greater than anginal qualities.  No further ischemia testing.   Palpitations:  I will send him a four week monitor and follow up afterward.  Risk reduction:  We talked at great length about exercise, diet, ETOH, sleep.     Rollene Rotunda  6:13 PM  12/17/2022

## 2022-12-17 NOTE — Progress Notes (Signed)
Plan of Care Note for accepted transfer   Patient: Billy [name] MRN: [id number]   DOA: 12/17/2022  Facility requesting transfer: [facility]   Requesting Provider: Dr. Bernette Mayers   Reason for transfer: Chest pain   Facility course: 42 yr old man with chronic cough presents with CP and SOB after exercising. No significant change noted on EKG and no acute CXR findings. Troponin was 24 and then 24.   He was treated with 324 mg ASA and one SL nitro in ED and pain has resolved.   Plan of care: The patient is accepted for admission to Telemetry unit, at [facility].   Author: Briscoe Deutscher, MD 12/17/2022  Check [email or web address] for on-call coverage.  Nursing staff, Please call [facility] Admits & Consults System-Wide number on Amion as soon as patient's arrival, so appropriate admitting provider can evaluate the pt.

## 2022-12-17 NOTE — Progress Notes (Signed)
Echocardiogram 2D Echocardiogram has been performed.  Billy [name] 12/17/2022, 3:13 PM

## 2022-12-17 NOTE — ED Notes (Signed)
Called [facility] -- informed that the pt bed assignment is ready

## 2022-12-17 NOTE — ED Notes (Signed)
Pt remains chest pain free.

## 2022-12-17 NOTE — H&P (Signed)
History and Physical    Patient: Billy [name] ZOX:[id number] DOB: 1980/11/17 DOA: 12/17/2022 DOS: the patient was seen and examined on 12/17/2022 PCP: Patient, No Pcp Per  Patient coming from: Transfer from [facility]  Chief Complaint:  Chief Complaint  Patient presents with   Shortness of Breath   Chest Pain   HPI: Billy [name] is a 42 y.o. male with medical history significant of heart murmur and anxiety who presented with chest pain and shortness of breath.  Symptoms started yesterday after he had done cardio at his home in the garage.  After he completed his workout he went into the shower and acutely reported feeling short of breath with tightness in his chest.  Patient reports having history of allergies, but states that they have not been affecting him much and denies any history of asthma.  After getting out of the shower patient ate a couple bites and felt like he was full but reported the chest tightness symptoms seem to persist and worsen.  He reported associated symptoms of feeling his heart skipping around.  Denied having any fever, nausea, vomiting, or diaphoresis, leg swelling, or calf pain symptoms.  With his job he does report sitting for prolonged periods either at his desk as he works from home or driving in the car.  He states that  in the past he play smoked previously to get breaks away from his job as it was stressful.  He does drink alcohol but not on a daily basis and denies any drug use.  Patient reports a significant family history of hypertension on his mother and dad's side of the family.  His maternal uncle reportedly had a heart attack at the age of 3.  Lastly patient does bring up the fact that he recently after drinking he had issues with left arm numbness and generalized malaise thereafter that has happened on more than 1 occurrence..  In the emergency department patient was noted to have heart rates 49-90, respiration 12-24, and all other vital signs relatively  maintained.  EKG did not note any significant ischemic changes.  Labs significant for WBC 11.8, creatinine 1.4, BUN 11, high-sensitivity troponin is 24->34->20.  Chest x-ray noted no acute abnormality.  Patient had  been given full dose aspirin.    Review of Systems: As mentioned in the history of present illness. All other systems reviewed and are negative. Past Medical History:  Diagnosis Date   Allergy    Anxiety    Facial fracture (HCC)    Headache    Heart murmur    Shoulder pain, right    Past Surgical History:  Procedure Laterality Date   WISDOM TOOTH EXTRACTION     Social History:  reports that he has quit smoking. His smoking use included cigarettes. He has a 0.25 pack-year smoking history. He has been exposed to tobacco smoke. He has never used smokeless tobacco. He reports current alcohol use of about 3.0 - 4.0 standard drinks of alcohol per week. He reports that he does not currently use drugs after having used the following drugs: Marijuana.  Allergies  Allergen Reactions   Dust Mite Extract Itching and Other (See Comments)    Sneezing, dry eyes   Other Other (See Comments)   Latex Other (See Comments)    Other   Molds & Smuts Itching and Other (See Comments)    Sneezing,dry eyes Sneezing,dry eyes    Family History  Problem Relation Age of Onset   Hypertension Mother  Diabetes Mother    Diabetes Maternal Grandmother    Heart disease Maternal Grandmother    Stroke Maternal Grandmother    Hyperlipidemia Maternal Grandmother    Hypertension Maternal Grandmother    Stroke Maternal Grandfather    Heart disease Maternal Grandfather    Diabetes Maternal Grandfather    Hyperlipidemia Maternal Grandfather    Diabetes Paternal Grandmother    Hyperlipidemia Paternal Grandmother    Hypertension Paternal Grandmother    Hypertension Paternal Grandfather    Cancer Paternal Grandfather    Healthy Father    Kidney disease Maternal Aunt    Heart disease Maternal Uncle      Prior to Admission medications   Medication Sig Start Date End Date Taking? Authorizing Provider  ALPRAZolam Prudy Feeler) 1 MG tablet Take 1 mg by mouth daily as needed for anxiety. 05/10/19   [provider]  Cetirizine HCl (ZYRTEC ALLERGY) 10 MG CAPS Take 1 capsule (10 mg total) by mouth daily. 10/12/22   Peter Garter, PA  chlorhexidine gluconate, MEDLINE KIT, (PERIDEX) 0.12 % solution Use as directed 15 mLs in the mouth or throat 2 (two) times daily. 06/01/21   Couture, Cortni S, PA-C  ciprofloxacin (CILOXAN) 0.3 % ophthalmic solution Place 2 drops into the right eye every 4 (four) hours while awake. Administer 1 drop, every 2 hours, while awake, for 2 days. Then 1 drop, every 4 hours, while awake, for the next 5 days. 10/12/22   Peter Garter, PA  fluticasone (FLONASE) 50 MCG/ACT nasal spray Place 1 spray into both nostrils daily. 12/14/20   Petrucelli, Samantha R, PA-C  methocarbamol (ROBAXIN) 500 MG tablet Take 1 tablet (500 mg total) by mouth 2 (two) times daily. 06/01/21   Couture, Cortni S, PA-C  nortriptyline (PAMELOR) 50 MG capsule Take 2 capsules (100 mg total) by mouth at bedtime. 12/02/16   Levert Feinstein, MD  PRESCRIPTION MEDICATION Take 1 tablet by mouth at bedtime as needed (sleep).    [provider]    Physical Exam: Vitals:   12/17/22 0645 12/17/22 0700 12/17/22 1000 12/17/22 1130  BP:   (!) 125/100 133/89  Pulse: (!) 49 (!) 55 61 60  Resp: 14 15 16 16   Temp:   98.2 F (36.8 C) 97.9 F (36.6 C)  TempSrc:    Oral  SpO2: 97% 97% 98%    Constitutional: Middle-age male who appears to be in no acute distress at this time. Eyes: PERRL, lids and conjunctivae normal ENMT: Mucous membranes are moist. Normal dentition.  Neck: normal, supple  Respiratory: Normal respiratory effort with normal intermittent squeaks, but no significant wheezes, crackles, or rhonchi appreciated.  cardiovascular: Regular rate and rhythm, no murmurs / rubs / gallops. No extremity  edema.  Chest pain was not reproducible with palpation.  Abdomen: no tenderness. Bowel sounds positive.  Musculoskeletal: no clubbing / cyanosis noted of the hands.  No gross deformities appreciated on visible inspection Skin: no rashes, lesions, ulcers. No induration Neurologic: CN 2-12 grossly intact. Strength 5/5 in all 4.  Psychiatric: Normal judgment and insight. Alert and oriented x 3.   Data Reviewed:  Reviewed telemetry monitoring noted PVCs and missed beats.  Reviewed labs, imaging, and pertinent records as noted above in HPI.  Assessment and Plan: Chest pain and shortness of breath Elevated troponin Acute.  Patient presents with complaints of chest pain and shortness of breath.  EKG did not note any significant ischemic changes.  Initial high-sensitivity troponins 24->34->20.  Chest x-ray did not note any  acute abnormality.  Patient reports family history with uncle with heart attack at the age of 73. -Admit to a telemetry bed -Check lipid panel and D-dimer -Check echocardiogram -Asa -Cardiology consulted for possible need of stress test -Albuterol nebs as needed for shortness of breath/wheezing  Acute kidney injury Creatinine elevated at 1.4 with BUN 11 when checked yesterday.  Suspect patient was brought a little dehydrated based of history of doing cardio prior to onset of symptoms and going to the emergency department. -Normal saline IV fluids at 100 mL/h for at least 1 L -Continue to monitor kidney function daily  Palpitations Patient reports having palpitations frequently.  On telemetry monitoring patient was noted to have intermittent preventricular contractions as well as missed beats. -Checked TSH -Goal potassium at least 4 and magnesium at least 2 -May benefit from Holter monitor at discharge  Hyperlipidemia LDL 113 and HDL 52. -Goal LDL less than 100 -Advised patient on a heart healthy healthy diet with low cholesterol and about supplements including omega 3  fatty acids -Follow-up with primary care provider in regards to monitoring of cholesterol  Anxiety Records note patient had previously been on nortriptyline unclear if patient still taking this medication.    DVT prophylaxis: Lovenox Advance Care Planning:   Code Status: Full Code    Consults: Cardiology  Family Communication: Friend updated at bedside  Severity of Illness: The appropriate patient status for this patient is OBSERVATION. Observation status is judged to be reasonable and necessary in order to provide the required intensity of service to ensure the patient's safety. The patient's presenting symptoms, physical exam findings, and initial radiographic and laboratory data in the context of their medical condition is felt to place them at decreased risk for further clinical deterioration. Furthermore, it is anticipated that the patient will be medically stable for discharge from the hospital within 2 midnights of admission.   Author: Clydie Braun, MD 12/17/2022 12:19 PM  For on call review [email or web address].

## 2022-12-17 NOTE — ED Notes (Signed)
PRN NTG given for chest pain per MD order. Pt reports complete relief of chest pain following 1 NTG

## 2022-12-17 NOTE — Progress Notes (Signed)
    30 day monitor for palpitations. Results to Dr. Antoine Poche.

## 2022-12-17 NOTE — ED Notes (Signed)
Pt remain CP free. Updated regarding pending bed assignment at [facility]

## 2022-12-17 NOTE — ED Provider Notes (Signed)
[facility] AT [facility]  Provider Note  CSN: [id number] Arrival date & time: 12/16/22 2203  History Chief Complaint  Patient presents with   Shortness of Breath   Chest Pain    Billy [name] is a 42 y.o. male with no significant PMH reports after doing a cardio workout earlier tonight he began to feel SOB and then had moderate midsternal chest pressure. He reports symptoms improved but not completely resolved while in the ED waiting room. He has had a prior ED visit for chest pain in 2022 but workup then was normal. Denies any recent travel, no leg swelling. No known HTN, DM or HLD. He was a former smoker, no drug use.    Home Medications Prior to Admission medications   Medication Sig Start Date End Date Taking? Authorizing Provider  ALPRAZolam Prudy Feeler) 1 MG tablet Take 1 mg by mouth daily as needed for anxiety. 05/10/19   [provider]  Cetirizine HCl (ZYRTEC ALLERGY) 10 MG CAPS Take 1 capsule (10 mg total) by mouth daily. 10/12/22   Peter Garter, PA  chlorhexidine gluconate, MEDLINE KIT, (PERIDEX) 0.12 % solution Use as directed 15 mLs in the mouth or throat 2 (two) times daily. 06/01/21   Couture, Cortni S, PA-C  ciprofloxacin (CILOXAN) 0.3 % ophthalmic solution Place 2 drops into the right eye every 4 (four) hours while awake. Administer 1 drop, every 2 hours, while awake, for 2 days. Then 1 drop, every 4 hours, while awake, for the next 5 days. 10/12/22   Peter Garter, PA  fluticasone (FLONASE) 50 MCG/ACT nasal spray Place 1 spray into both nostrils daily. 12/14/20   Petrucelli, Samantha R, PA-C  methocarbamol (ROBAXIN) 500 MG tablet Take 1 tablet (500 mg total) by mouth 2 (two) times daily. 06/01/21   Couture, Cortni S, PA-C  nortriptyline (PAMELOR) 50 MG capsule Take 2 capsules (100 mg total) by mouth at bedtime. 12/02/16   Levert Feinstein, MD  PRESCRIPTION MEDICATION Take 1 tablet by mouth at bedtime as needed (sleep).    [provider]     Allergies    Dust mite extract, Other, Latex, and Molds & smuts   Review of Systems   Review of Systems Please see HPI for pertinent positives and negatives  Physical Exam BP (!) 124/94 (BP Location: Left Arm)   Pulse 67   Temp 98 F (36.7 C) (Oral)   Resp 19   SpO2 98%   Physical Exam Vitals and nursing note reviewed.  Constitutional:      Appearance: Normal appearance.  HENT:     Head: Normocephalic and atraumatic.     Nose: Nose normal.     Mouth/Throat:     Mouth: Mucous membranes are moist.  Eyes:     Extraocular Movements: Extraocular movements intact.     Conjunctiva/sclera: Conjunctivae normal.  Cardiovascular:     Rate and Rhythm: Normal rate.  Pulmonary:     Effort: Pulmonary effort is normal.     Breath sounds: Normal breath sounds.  Chest:     Chest wall: No tenderness.  Abdominal:     General: Abdomen is flat.     Palpations: Abdomen is soft.     Tenderness: There is no abdominal tenderness.  Musculoskeletal:        General: No swelling. Normal range of motion.     Cervical back: Neck supple.  Skin:    General: Skin is warm and dry.  Neurological:  General: No focal deficit present.     Mental Status: He is alert.  Psychiatric:        Mood and Affect: Mood normal.     ED Results / Procedures / Treatments   EKG EKG Interpretation  Date/Time:  Wednesday Dec 16 2022 22:14:45 EDT Ventricular Rate:  87 PR Interval:  142 QRS Duration: 88 QT Interval:  342 QTC Calculation: 411 R Axis:   -49 Text Interpretation: Normal sinus rhythm Left axis deviation Cannot rule out Anterior infarct , age undetermined Abnormal ECG When compared with ECG of 01-Jun-2021 14:07, No significant change was found Confirmed by Alona Bene [phone]) on 12/16/2022 10:48:30 PM  Procedures Procedures  Medications Ordered in the ED Medications  albuterol (VENTOLIN HFA) 108 (90 Base) MCG/ACT inhaler 2 puff (has no administration in time range)   nitroGLYCERIN (NITROSTAT) SL tablet 0.4 mg (0.4 mg Sublingual Given 12/17/22 0155)  aspirin chewable tablet 324 mg (324 mg Oral Given 12/17/22 0152)    Initial Impression and Plan  Patient with low risk factor profile here with CP/SOB after working out. Did not have pain during his work out but began shortly afterwards. EKG is non-ischemic. Labs done in triage show CBC with mild leukocytosis, CMP with Cr increased from prior baseline, 1.0->1.4. Initial Trop is 24 increased to 34 on recheck. Will begin ASA/NTG for residual pain. Plan admission for further evaluation. Hospitalist paged.   ED Course   Clinical Course as of 12/17/22 0211  Thu Dec 17, 2022  0210 Spoke with Dr. Antionette Char, Hospitalist, who will accept the patient for admission.  [CS]    Clinical Course User Index [CS] Pollyann Savoy, MD     MDM Rules/Calculators/A&P Medical Decision Making Problems Addressed: Chest pain, unspecified type: acute illness or injury that poses a threat to life or bodily functions Elevated troponin: acute illness or injury  Amount and/or Complexity of Data Reviewed Labs: ordered. Decision-making details documented in ED Course. Radiology: ordered and independent interpretation performed. Decision-making details documented in ED Course. ECG/medicine tests: ordered and independent interpretation performed. Decision-making details documented in ED Course.  Risk OTC drugs. Prescription drug management. Decision regarding hospitalization.     Final Clinical Impression(s) / ED Diagnoses Final diagnoses:  Chest pain, unspecified type  Elevated troponin    Rx / DC Orders ED Discharge Orders     None        Pollyann Savoy, MD 12/17/22 [phone]

## 2022-12-18 DIAGNOSIS — R002 Palpitations: Secondary | ICD-10-CM | POA: Diagnosis not present

## 2022-12-18 DIAGNOSIS — R072 Precordial pain: Secondary | ICD-10-CM | POA: Diagnosis not present

## 2022-12-18 LAB — BASIC METABOLIC PANEL
Anion gap: 10 (ref 5–15)
BUN: 12 mg/dL (ref 6–20)
CO2: 22 mmol/L (ref 22–32)
Calcium: 9.2 mg/dL (ref 8.9–10.3)
Chloride: 107 mmol/L (ref 98–111)
Creatinine, Ser: 1.06 mg/dL (ref 0.61–1.24)
GFR, Estimated: >60 mL/min (ref >60)
Glucose, Bld: 99 mg/dL (ref 70–99)
Potassium: 3.4 mmol/L — ABNORMAL LOW (ref 3.5–5.1)
Sodium: 139 mmol/L (ref 135–145)

## 2022-12-18 LAB — URINALYSIS, DIPSTICK ONLY
Bilirubin Urine: NEGATIVE
Glucose, UA: NEGATIVE mg/dL
Ketones, ur: NEGATIVE mg/dL
Leukocytes,Ua: NEGATIVE
Nitrite: NEGATIVE
Protein, ur: NEGATIVE mg/dL
Specific Gravity, Urine: 1.011 (ref 1.005–1.030)
pH: 5 (ref 5.0–8.0)

## 2022-12-18 LAB — HEMOGLOBIN A1C
Hgb A1c MFr Bld: 5.6 % (ref 4.8–5.6)
Mean Plasma Glucose: 114.02 mg/dL

## 2022-12-18 MED ORDER — MELATONIN 3 MG PO TABS
3.0000 mg | ORAL_TABLET | Freq: Every evening | ORAL | Status: DC | PRN
  Administered 2022-12-18: 3 mg via ORAL
  Filled 2022-12-18: qty 1

## 2022-12-18 NOTE — Discharge Summary (Signed)
Physician Discharge Summary   Patient: Billy [name] MRN: [id number] DOB: 24-Dec-1980  Admit date:     12/17/2022  Discharge date: 12/18/22  Discharge Physician: Alberteen Sam   PCP: Patient, No Pcp Per     Recommendations at discharge:  Follow up with Cardiology for palpitations; dishcarged with cardiac event monitor Follow up with new PCP     Discharge Diagnoses: Principal Problem:   Chest pain Active Problems:   Elevated troponin   AKI (acute kidney injury) [facility])   Palpitations   Hyperlipidemia   Anxiety      Hospital Course: [name] is a 42 y.o. M with no significant PMHx who presented with chest pain, palpitations.      Noncardiac chest pain Patient admitted and observed overnight.  No further chest pains.  Echocardiogram showed normal EF, normal wall motion. Chest pain suspected by Cardiology to be nonischemic.  They recommended no further work up.  Palpitations Unclear etiology.  Patient felt some abnormal heart beats overnight but had no ectopy or arrhythmia on overnight monitoring. Cardiology recommended outpatient cardiac event monitor, which they have arranged.  Myocardial injury Low troponin elevations noted on serial measurement.  Reviewed with cardiology, this was likely poor clearance in the setting of vigorous exertion, no further ischemic work up recommended.  Acute kidney injury Cr 1.4 at admission, up from baseline 0.9-1.  Treated with fluids and resolve to baseline.           The [facility] Controlled Substances Registry was reviewed for this patient prior to discharge.   Consultants: Cardiology Procedures performed: Echo  Disposition: Home Diet recommendation:  Regular diet  DISCHARGE MEDICATION: Allergies as of 12/18/2022       Reactions   Dust Mite Extract Itching, Other (See Comments)   Sneezing, dry eyes   Latex Itching   Molds & Smuts Itching, Other (See Comments)   Sneezing,dry eyes        Medication List      TAKE these medications    ALPRAZolam 1 MG tablet Commonly known as: XANAX Take 1 mg by mouth daily as needed for anxiety.   chlorhexidine gluconate (MEDLINE KIT) 0.12 % solution Commonly known as: PERIDEX Use as directed 15 mLs in the mouth or throat 2 (two) times daily. What changed: when to take this   ciprofloxacin 0.3 % ophthalmic solution Commonly known as: Ciloxan Place 2 drops into the right eye every 4 (four) hours while awake. Administer 1 drop, every 2 hours, while awake, for 2 days. Then 1 drop, every 4 hours, while awake, for the next 5 days.   fluticasone 50 MCG/ACT nasal spray Commonly known as: FLONASE Place 1 spray into both nostrils daily.   ketoconazole 2 % shampoo Commonly known as: NIZORAL Apply 1 Application topically 2 (two) times a week.   multivitamin with minerals tablet Take 1 tablet by mouth daily.   nortriptyline 50 MG capsule Commonly known as: PAMELOR Take 2 capsules (100 mg total) by mouth at bedtime.   OVER THE COUNTER MEDICATION Place 1 drop into both eyes as needed (dry eye). Acuvue   ZyrTEC Allergy 10 MG Caps Generic drug: Cetirizine HCl Take 1 capsule (10 mg total) by mouth daily.        Follow-up Information     New primary care Follow up.   Why: As soon as able to schedule an appointment        [facility] Follow up.   Why: Patient can call  this office for primary care provider to establish care. Contact information: [address] [place] [address] [phone]                Discharge Instructions     Discharge instructions   Complete by: As directed    **IMPORTANT DISCHARGE INSTRUCTIONS**   From Dr. Maryfrances Bunnell: You were evaluated for chest tightness and shortness of breath. Here, we performed an EKG (electrical activity of the heart) that showed no findings concerning for active heart attack.  You had heart enzymes (troponin) measured, that were low and  flat.    You had an echocardiogram (ultrasound of the heart) that was normal  The heart specialists were consulted, and felt that your testing was reassuring and your symptoms were more likely the result of not eating during the day and vigorous exercise, not related to heart disease  Because of the palpitations, they recommend a 1 month monitor.  While you were here, you had a mild rise in your creatinine (test of kidney function).  This went back to normal overnight, and your kidney function today is normal, showing no signs of chronic kidney disease.  You should obtain a primary care doctor for routine health screenings   Increase activity slowly   Complete by: As directed        Discharge Exam: There were no vitals filed for this visit.  General: Pt is alert, awake, not in acute distress Neuro/Psych: Speech fluent, face symmetric.  Judgment and insight appear normal.   Condition at discharge: good  The results of significant diagnostics from this hospitalization (including imaging, microbiology, ancillary and laboratory) are listed below for reference.   Imaging Studies: ECHOCARDIOGRAM COMPLETE  Result Date: 12/17/2022    ECHOCARDIOGRAM REPORT   Patient Name:   Billy [name] Date of Exam: 12/17/2022 Medical Rec #:  [id number]     Height:       67.0 in Accession #:    [id number]    Weight:       180.0 lb Date of Birth:  12-22-1980      BSA:          1.934 m Patient Age:    42 years      BP:           133/89 mmHg Patient Gender: M             HR:           73 bpm. Exam Location:  Inpatient Procedure: 2D Echo, 3D Echo, Cardiac Doppler, Color Doppler and Strain Analysis Indications:    Chest Pain  History:        Patient has no prior history of Echocardiogram examinations.                 Signs/Symptoms:Chest Pain; Risk Factors:Former Smoker.  Sonographer:    Raeford Razor Sonographer#2:  Irving Burton Senior Referring Phys: [phone] RONDELL A SMITH IMPRESSIONS  1. Left ventricular ejection fraction, by  estimation, is 65 to 70%. The left ventricle has normal function. The left ventricle has no regional wall motion abnormalities. There is mild left ventricular hypertrophy. Left ventricular diastolic parameters were normal. The average left ventricular global longitudinal strain is -21.0 %. The global longitudinal strain is normal.  2. Right ventricular systolic function is normal. The right ventricular size is normal.  3. The mitral valve is grossly normal. Trivial mitral valve regurgitation.  4. The aortic valve is tricuspid. Aortic valve regurgitation is not visualized.  5.  The inferior vena cava is normal in size with greater than 50% respiratory variability, suggesting right atrial pressure of 3 mmHg. Comparison(s): No prior Echocardiogram. FINDINGS  Left Ventricle: Left ventricular ejection fraction, by estimation, is 65 to 70%. The left ventricle has normal function. The left ventricle has no regional wall motion abnormalities. The average left ventricular global longitudinal strain is -21.0 %. The global longitudinal strain is normal. The left ventricular internal cavity size was normal in size. There is mild left ventricular hypertrophy. Left ventricular diastolic parameters were normal. Right Ventricle: The right ventricular size is normal. No increase in right ventricular wall thickness. Right ventricular systolic function is normal. Left Atrium: Left atrial size was normal in size. Right Atrium: Right atrial size was normal in size. Pericardium: There is no evidence of pericardial effusion. Mitral Valve: The mitral valve is grossly normal. Trivial mitral valve regurgitation. Tricuspid Valve: The tricuspid valve is grossly normal. Tricuspid valve regurgitation is trivial. Aortic Valve: The aortic valve is tricuspid. Aortic valve regurgitation is not visualized. Pulmonic Valve: The pulmonic valve was grossly normal. Pulmonic valve regurgitation is trivial. Aorta: The aortic root and ascending aorta are  structurally normal, with no evidence of dilitation. Venous: The inferior vena cava is normal in size with greater than 50% respiratory variability, suggesting right atrial pressure of 3 mmHg. IAS/Shunts: No atrial level shunt detected by color flow Doppler.  LEFT VENTRICLE PLAX 2D LVIDd:         4.40 cm   Diastology LVIDs:         2.50 cm   LV e' medial:    9.57 cm/s LV PW:         1.40 cm   LV E/e' medial:  9.2 LV IVS:        0.80 cm   LV e' lateral:   11.90 cm/s LVOT diam:     2.00 cm   LV E/e' lateral: 7.4 LV SV:         82 LV SV Index:   42        2D Longitudinal Strain LVOT Area:     3.14 cm  2D Strain GLS (A2C):   -20.8 %                          2D Strain GLS (A3C):   -21.4 %                          2D Strain GLS (A4C):   -20.7 %                          2D Strain GLS Avg:     -21.0 %                           3D Volume EF:                          3D EF:        58 %                          LV EDV:       162 ml  LV ESV:       67 ml                          LV SV:        94 ml RIGHT VENTRICLE          IVC RV Basal diam:  3.20 cm  IVC diam: 1.00 cm LEFT ATRIUM             Index        RIGHT ATRIUM           Index LA diam:        3.80 cm 1.97 cm/m   RA Area:     16.00 cm LA Vol (A2C):   42.3 ml 21.88 ml/m  RA Volume:   41.90 ml  21.67 ml/m LA Vol (A4C):   25.7 ml 13.29 ml/m LA Biplane Vol: 35.0 ml 18.10 ml/m  AORTIC VALVE LVOT Vmax:   153.00 cm/s LVOT Vmean:  94.300 cm/s LVOT VTI:    0.261 m  AORTA Ao Root diam: 3.40 cm Ao Asc diam:  2.70 cm MITRAL VALVE MV Area (PHT): 2.88 cm    SHUNTS MV Decel Time: 263 msec    Systemic VTI:  0.26 m MV E velocity: 88.30 cm/s  Systemic Diam: 2.00 cm MV A velocity: 55.80 cm/s MV E/A ratio:  1.58 Zoila Shutter MD Electronically signed by Zoila Shutter MD Signature Date/Time: 12/17/2022/4:32:25 PM    Final    DG Chest 2 View  Result Date: 12/16/2022 CLINICAL DATA:  Shortness of breath. EXAM: CHEST - 2 VIEW COMPARISON:  Chest radiograph dated  05/01/2022. FINDINGS: The heart size and mediastinal contours are within normal limits. Both lungs are clear. The visualized skeletal structures are unremarkable. IMPRESSION: No active cardiopulmonary disease. Electronically Signed   By: Elgie Collard M.D.   On: 12/16/2022 22:25    Microbiology: Results for orders placed or performed during the hospital encounter of 12/13/20  Resp Panel by RT-PCR (Flu A&B, Covid) Nasopharyngeal Swab     Status: None   Collection Time: 12/14/20 12:50 AM   Specimen: Nasopharyngeal Swab; Nasopharyngeal(NP) swabs in vial transport medium  Result Value Ref Range Status   SARS Coronavirus 2 by RT PCR NEGATIVE NEGATIVE Final    Comment: (NOTE) SARS-CoV-2 target nucleic acids are NOT DETECTED.  The SARS-CoV-2 RNA is generally detectable in upper respiratory specimens during the acute phase of infection. The lowest concentration of SARS-CoV-2 viral copies this assay can detect is 138 copies/mL. A negative result does not preclude SARS-Cov-2 infection and should not be used as the sole basis for treatment or other patient management decisions. A negative result may occur with  improper specimen collection/handling, submission of specimen other than nasopharyngeal swab, presence of viral mutation(s) within the areas targeted by this assay, and inadequate number of viral copies(<138 copies/mL). A negative result must be combined with clinical observations, patient history, and epidemiological information. The expected result is Negative.  Fact Sheet for Patients:  BloggerCourse.com  Fact Sheet for Healthcare Providers:  SeriousBroker.it  This test is no t yet approved or cleared by the [place] FDA and  has been authorized for detection and/or diagnosis of SARS-CoV-2 by FDA under an Emergency Use Authorization (EUA). This EUA will remain  in effect (meaning this test can be used) for the duration of  the COVID-19 declaration under Section 564(b)(1) of the Act, 21 U.S.C.section 360bbb-3(b)(1), unless the authorization is terminated  or  revoked sooner.       Influenza A by PCR NEGATIVE NEGATIVE Final   Influenza B by PCR NEGATIVE NEGATIVE Final    Comment: (NOTE) The Xpert Xpress SARS-CoV-2/FLU/RSV plus assay is intended as an aid in the diagnosis of influenza from Nasopharyngeal swab specimens and should not be used as a sole basis for treatment. Nasal washings and aspirates are unacceptable for Xpert Xpress SARS-CoV-2/FLU/RSV testing.  Fact Sheet for Patients: BloggerCourse.com  Fact Sheet for Healthcare Providers: SeriousBroker.it  This test is not yet approved or cleared by the [place] FDA and has been authorized for detection and/or diagnosis of SARS-CoV-2 by FDA under an Emergency Use Authorization (EUA). This EUA will remain in effect (meaning this test can be used) for the duration of the COVID-19 declaration under Section 564(b)(1) of the Act, 21 U.S.C. section 360bbb-3(b)(1), unless the authorization is terminated or revoked.  Performed at [facility], [address], [place], [place] [address]     Labs: CBC: Recent Labs  Lab 12/16/22 2215  WBC 11.8*  NEUTROABS 9.5*  HGB 15.2  HCT 42.6  MCV 89.5  PLT 171   Basic Metabolic Panel: Recent Labs  Lab 12/16/22 2215 12/17/22 1814 12/18/22 1108  NA 139 138 139  K 3.5 3.3* 3.4*  CL 103 106 107  CO2 28 24 22   GLUCOSE 98 94 99  BUN 11 13 12   CREATININE 1.40* 1.14 1.06  CALCIUM 9.7 9.0 9.2  MG  --  2.0  --    Liver Function Tests: Recent Labs  Lab 12/16/22 2215  AST 23  ALT 24  ALKPHOS 73  BILITOT 1.1  PROT 7.9  ALBUMIN 4.6   CBG: No results for input(s): "GLUCAP" in the last 168 hours.    Signed: Alberteen Sam, MD [facility] 12/18/2022

## 2022-12-18 NOTE — Progress Notes (Signed)
Progress Note  Patient Name: Billy [name] Date of Encounter: 12/18/2022  Primary Cardiologist:   None   Subjective   He thinks he might have stopped breathing last night.  He thought he had some palpitations.   Inpatient Medications    Scheduled Meds:  enoxaparin (LOVENOX) injection  40 mg Subcutaneous Daily   fluticasone  1 spray Each Nare Daily   Continuous Infusions:  PRN Meds: acetaminophen, albuterol, alum & mag hydroxide-simeth, melatonin, nitroGLYCERIN, ondansetron (ZOFRAN) IV   Vital Signs    Vitals:   12/17/22 2052 12/18/22 0012 12/18/22 0556 12/18/22 0716  BP: 122/83 129/83 104/67 113/82  Pulse: 64 63  70  Resp: 18 16 18 16   Temp: 97.8 F (36.6 C) 98.4 F (36.9 C) 97.9 F (36.6 C) 97.6 F (36.4 C)  TempSrc: Oral Oral Oral Oral  SpO2: 98% 97% 99% 98%    Intake/Output Summary (Last 24 hours) at 12/18/2022 1610 Last data filed at 12/17/2022 1840 Gross per 24 hour  Intake 153.59 ml  Output 200 ml  Net -46.41 ml   There were no vitals filed for this visit.  Telemetry    NSR, sinus arrhythmia. No ectopy noted.  - Personally Reviewed  ECG    NA - Personally Reviewed  Physical Exam   GEN: No acute distress.   Neck: No  JVD Cardiac: RRR, no murmurs, rubs, or gallops.  Respiratory: Clear  to auscultation bilaterally. GI: Soft, nontender, non-distended  MS: No  edema; No deformity. Neuro:  Nonfocal  Psych: Normal affect   Labs    Chemistry Recent Labs  Lab 12/16/22 2215 12/17/22 1814  NA 139 138  K 3.5 3.3*  CL 103 106  CO2 28 24  GLUCOSE 98 94  BUN 11 13  CREATININE 1.40* 1.14  CALCIUM 9.7 9.0  PROT 7.9  --   ALBUMIN 4.6  --   AST 23  --   ALT 24  --   ALKPHOS 73  --   BILITOT 1.1  --   GFRNONAA >60 >60  ANIONGAP 8 8     Hematology Recent Labs  Lab 12/16/22 2215  WBC 11.8*  RBC 4.76  HGB 15.2  HCT 42.6  MCV 89.5  MCH 31.9  MCHC 35.7  RDW 12.5  PLT 171    Cardiac EnzymesNo results for input(s): "TROPONINI" in  the last 168 hours. No results for input(s): "TROPIPOC" in the last 168 hours.   BNPNo results for input(s): "BNP", "PROBNP" in the last 168 hours.   DDimer  Recent Labs  Lab 12/17/22 1814  DDIMER <0.27     Radiology    ECHOCARDIOGRAM COMPLETE  Result Date: 12/17/2022    ECHOCARDIOGRAM REPORT   Patient Name:   Billy [name] Date of Exam: 12/17/2022 Medical Rec #:  [id number]     Height:       67.0 in Accession #:    [id number]    Weight:       180.0 lb Date of Birth:  1981-08-09      BSA:          1.934 m Patient Age:    41 years      BP:           133/89 mmHg Patient Gender: M             HR:           73 bpm. Exam Location:  Inpatient Procedure: 2D Echo, 3D Echo, Cardiac Doppler, Color Doppler  and Strain Analysis Indications:    Chest Pain  History:        Patient has no prior history of Echocardiogram examinations.                 Signs/Symptoms:Chest Pain; Risk Factors:Former Smoker.  Sonographer:    Raeford Razor Sonographer#2:  Irving Burton Senior Referring Phys: [phone] RONDELL A SMITH IMPRESSIONS  1. Left ventricular ejection fraction, by estimation, is 65 to 70%. The left ventricle has normal function. The left ventricle has no regional wall motion abnormalities. There is mild left ventricular hypertrophy. Left ventricular diastolic parameters were normal. The average left ventricular global longitudinal strain is -21.0 %. The global longitudinal strain is normal.  2. Right ventricular systolic function is normal. The right ventricular size is normal.  3. The mitral valve is grossly normal. Trivial mitral valve regurgitation.  4. The aortic valve is tricuspid. Aortic valve regurgitation is not visualized.  5. The inferior vena cava is normal in size with greater than 50% respiratory variability, suggesting right atrial pressure of 3 mmHg. Comparison(s): No prior Echocardiogram. FINDINGS  Left Ventricle: Left ventricular ejection fraction, by estimation, is 65 to 70%. The left ventricle has normal function.  The left ventricle has no regional wall motion abnormalities. The average left ventricular global longitudinal strain is -21.0 %. The global longitudinal strain is normal. The left ventricular internal cavity size was normal in size. There is mild left ventricular hypertrophy. Left ventricular diastolic parameters were normal. Right Ventricle: The right ventricular size is normal. No increase in right ventricular wall thickness. Right ventricular systolic function is normal. Left Atrium: Left atrial size was normal in size. Right Atrium: Right atrial size was normal in size. Pericardium: There is no evidence of pericardial effusion. Mitral Valve: The mitral valve is grossly normal. Trivial mitral valve regurgitation. Tricuspid Valve: The tricuspid valve is grossly normal. Tricuspid valve regurgitation is trivial. Aortic Valve: The aortic valve is tricuspid. Aortic valve regurgitation is not visualized. Pulmonic Valve: The pulmonic valve was grossly normal. Pulmonic valve regurgitation is trivial. Aorta: The aortic root and ascending aorta are structurally normal, with no evidence of dilitation. Venous: The inferior vena cava is normal in size with greater than 50% respiratory variability, suggesting right atrial pressure of 3 mmHg. IAS/Shunts: No atrial level shunt detected by color flow Doppler.  LEFT VENTRICLE PLAX 2D LVIDd:         4.40 cm   Diastology LVIDs:         2.50 cm   LV e' medial:    9.57 cm/s LV PW:         1.40 cm   LV E/e' medial:  9.2 LV IVS:        0.80 cm   LV e' lateral:   11.90 cm/s LVOT diam:     2.00 cm   LV E/e' lateral: 7.4 LV SV:         82 LV SV Index:   42        2D Longitudinal Strain LVOT Area:     3.14 cm  2D Strain GLS (A2C):   -20.8 %                          2D Strain GLS (A3C):   -21.4 %                          2D Strain GLS (A4C):   -  20.7 %                          2D Strain GLS Avg:     -21.0 %                           3D Volume EF:                          3D EF:        58 %                           LV EDV:       162 ml                          LV ESV:       67 ml                          LV SV:        94 ml RIGHT VENTRICLE          IVC RV Basal diam:  3.20 cm  IVC diam: 1.00 cm LEFT ATRIUM             Index        RIGHT ATRIUM           Index LA diam:        3.80 cm 1.97 cm/m   RA Area:     16.00 cm LA Vol (A2C):   42.3 ml 21.88 ml/m  RA Volume:   41.90 ml  21.67 ml/m LA Vol (A4C):   25.7 ml 13.29 ml/m LA Biplane Vol: 35.0 ml 18.10 ml/m  AORTIC VALVE LVOT Vmax:   153.00 cm/s LVOT Vmean:  94.300 cm/s LVOT VTI:    0.261 m  AORTA Ao Root diam: 3.40 cm Ao Asc diam:  2.70 cm MITRAL VALVE MV Area (PHT): 2.88 cm    SHUNTS MV Decel Time: 263 msec    Systemic VTI:  0.26 m MV E velocity: 88.30 cm/s  Systemic Diam: 2.00 cm MV A velocity: 55.80 cm/s MV E/A ratio:  1.58 Zoila Shutter MD Electronically signed by Zoila Shutter MD Signature Date/Time: 12/17/2022/4:32:25 PM    Final    DG Chest 2 View  Result Date: 12/16/2022 CLINICAL DATA:  Shortness of breath. EXAM: CHEST - 2 VIEW COMPARISON:  Chest radiograph dated 05/01/2022. FINDINGS: The heart size and mediastinal contours are within normal limits. Both lungs are clear. The visualized skeletal structures are unremarkable. IMPRESSION: No active cardiopulmonary disease. Electronically Signed   By: Elgie Collard M.D.   On: 12/16/2022 22:25    Cardiac Studies   ECHO  1. Left ventricular ejection fraction, by estimation, is 65 to 70%. The  left ventricle has normal function. The left ventricle has no regional  wall motion abnormalities. There is mild left ventricular hypertrophy.  Left ventricular diastolic parameters  were normal. The average left ventricular global longitudinal strain is  -21.0 %. The global longitudinal strain is normal.   2. Right ventricular systolic function is normal. The right ventricular  size is normal.   3. The mitral valve is grossly normal. Trivial mitral valve  regurgitation.   4. The aortic  valve is tricuspid. Aortic valve regurgitation is not  visualized.   5. The inferior vena cava is normal in size with greater than 50%  respiratory variability, suggesting right atrial pressure of 3 mmHg.    Patient Profile     42 y.o. male with a hx of anxiety, possible heart murmur, who is being seen 12/17/2022 for the evaluation of chest pain at the request of Dr. Katrinka Blazing.    Assessment & Plan    CHEST PAIN:   Mildly elevated troponin are non diagnostic.  No change in therapy.   PALPITATIONS:  Plan four week monitor as an out patient.  This has been ordered and he has follow up with me afterward.      DYSLIPIDEMIA:  LDL is mildly elevated.  Discussed plant forward diet.   Hypokalemia:  Repeat BMET.    For questions or updates, please contact [facility] Please consult [email or web address] for contact info under Cardiology/STEMI.   Signed, Rollene Rotunda, MD  12/18/2022, 9:21 AM

## 2022-12-18 NOTE — Care Management (Addendum)
  Transition of Care [facility]) Screening Note   Patient Details  Name: Billy [name] Date of Birth: 1981-08-10   Transition [facility] Care [facility]) CM/SW Contact:    Gala Lewandowsky, RN Phone Number: 12/18/2022, 9:09 AM    Transition of Care Department [facility]) has reviewed the patient and no TOC needs have been identified at this time. Patient presented for chest pain. Patient has insurance Medicaid. We will continue to monitor patient advancement through interdisciplinary progression rounds. If new patient transition needs arise, please place a TOC consult.  1053 12-18-22 Case Manager spoke with patient regarding PCP needs. Patient previously was established with [facility] and is now in need of PCP. Case Manager offered the [facility]-patient would like to do some research on the clinic before scheduling an appointment. No further needs identified.

## 2022-12-20 DIAGNOSIS — M25572 Pain in left ankle and joints of left foot: Secondary | ICD-10-CM | POA: Diagnosis not present

## 2023-02-02 NOTE — Progress Notes (Deleted)
Cardiology Office Note:   Date:  02/02/2023  ID:  Jess Pillard, DOB 12/04/1980, MRN [id number] PCP: Patient, No Pcp Per  [facility] Providers Cardiologist:  None {  History of Present Illness:   Billy [name] is a 42 y.o. male who presented for evaluation of SOB.   He was thought to have non anginal chest pain.  He had palpitations.  He had mild AKI.  This resolved with hydration.  He had a normal echo.   He was to wear a monitor.  ***     ***on the night of 5/1 after he developed shortness of breath and  chest pain following a cardio workout earlier in the evening.  Patient says that on Tuesday evening he watched an [employer] with a group of friends and consumed approximately 6 high ABV beverages.  The next morning (Wednesday), patient says that he filled up his 1 gallon water jug to consume throughout the day but otherwise did not have any food to eat because he has been intermittently fasting to try to lose weight.  Around 7:00 on Wednesday evening, patient began to do a relatively cardio intense workout.  He says that he felt like he was more fatigued than usual and decided that he did finally need to eat some food.  Patient ate and then resume to the workout.  He denies having chest pain, chest tightness, shortness of breath while working out.  Following his workout while walking up the stairs to shower, patient says that he developed shortness of breath with chest tightness and sensation of rapid heart rate.  This persisted through his shower and prompted him to present to the emergency department at [facility] for further evaluation.  At [facility], patient received nitroglycerin and he says that this did help to improve his symptoms.  Patient notes a similar episode of chest tightness and rapid heart rate in the setting of drinking a couple of years ago.  However symptoms are not always associated with drinking and he reports that he has frequent but brief episodes of  palpitations, approximately once per day.  He does not associate any particular trigger of these episodes.  Patient denies limitation to daily physical activity.  At [facility], EKG without acute ischemic changes. Troponin checked, found very mildly elevated with minimal delta: 24->34->20. CBC with mild leukocytosis, 11.8. BMP showed creatinine elevated to 1.4 (never previously high).   ROS: ***  Studies Reviewed:    EKG:       ***  Risk Assessment/Calculations:   {Does this patient have ATRIAL FIBRILLATION?[phone]} No BP recorded.  {Refresh Note OR Click here to enter BP  :1}***        Physical Exam:   VS:  There were no vitals taken for this visit.   Wt Readings from Last 3 Encounters:  10/12/22 180 lb (81.6 kg)  06/01/21 180 lb (81.6 kg)  04/29/21 180 lb (81.6 kg)     GEN: Well nourished, well developed in no acute distress NECK: No JVD; No carotid bruits CARDIAC: ***RRR, no murmurs, rubs, gallops RESPIRATORY:  Clear to auscultation without rales, wheezing or rhonchi  ABDOMEN: Soft, non-tender, non-distended EXTREMITIES:  No edema; No deformity   ASSESSMENT AND PLAN:   Chest pain:  ***  Palpitations:  ***  Dyslipidemia:  ***   Hypokalemia:  ***     {Are you ordering a CV Procedure (e.g. stress test, cath, DCCV, TEE, etc)?   Press F2        :  [id number]}  Follow up ***  Signed, Rollene Rotunda, MD

## 2023-02-04 ENCOUNTER — Ambulatory Visit: Payer: Medicaid Other | Attending: Cardiology | Admitting: Cardiology

## 2023-02-04 DIAGNOSIS — R002 Palpitations: Secondary | ICD-10-CM

## 2023-02-04 DIAGNOSIS — E785 Hyperlipidemia, unspecified: Secondary | ICD-10-CM

## 2023-02-05 ENCOUNTER — Encounter: Payer: Self-pay | Admitting: Cardiology

## 2023-06-16 DIAGNOSIS — Z20822 Contact with and (suspected) exposure to covid-19: Secondary | ICD-10-CM | POA: Diagnosis not present

## 2023-06-16 DIAGNOSIS — R051 Acute cough: Secondary | ICD-10-CM | POA: Diagnosis not present

## 2023-06-16 DIAGNOSIS — N390 Urinary tract infection, site not specified: Secondary | ICD-10-CM | POA: Diagnosis not present

## 2023-06-16 DIAGNOSIS — R058 Other specified cough: Secondary | ICD-10-CM | POA: Diagnosis not present

## 2023-06-22 DIAGNOSIS — R059 Cough, unspecified: Secondary | ICD-10-CM | POA: Diagnosis not present

## 2023-06-22 DIAGNOSIS — R0789 Other chest pain: Secondary | ICD-10-CM | POA: Diagnosis not present

## 2023-12-10 DIAGNOSIS — J309 Allergic rhinitis, unspecified: Secondary | ICD-10-CM | POA: Diagnosis not present

## 2023-12-10 DIAGNOSIS — F419 Anxiety disorder, unspecified: Secondary | ICD-10-CM | POA: Diagnosis not present

## 2023-12-10 DIAGNOSIS — A6000 Herpesviral infection of urogenital system, unspecified: Secondary | ICD-10-CM | POA: Diagnosis not present

## 2024-04-08 DIAGNOSIS — A6000 Herpesviral infection of urogenital system, unspecified: Secondary | ICD-10-CM | POA: Diagnosis not present

## 2024-04-08 DIAGNOSIS — J029 Acute pharyngitis, unspecified: Secondary | ICD-10-CM | POA: Diagnosis not present

## 2024-04-08 DIAGNOSIS — J069 Acute upper respiratory infection, unspecified: Secondary | ICD-10-CM | POA: Diagnosis not present
